# Patient Record
Sex: Female | Born: 1965 | Race: White | Hispanic: No | Smoking: Former smoker
Health system: Southern US, Community
[De-identification: ages and names within clinical notes are randomized; demographics above are authoritative.]

## PROBLEM LIST (undated history)

## (undated) DIAGNOSIS — I10 Essential (primary) hypertension: Secondary | ICD-10-CM

## (undated) DIAGNOSIS — M1990 Unspecified osteoarthritis, unspecified site: Secondary | ICD-10-CM

## (undated) DIAGNOSIS — E559 Vitamin D deficiency, unspecified: Secondary | ICD-10-CM

## (undated) DIAGNOSIS — F329 Major depressive disorder, single episode, unspecified: Secondary | ICD-10-CM

## (undated) DIAGNOSIS — R011 Cardiac murmur, unspecified: Secondary | ICD-10-CM

## (undated) DIAGNOSIS — R682 Dry mouth, unspecified: Secondary | ICD-10-CM

## (undated) DIAGNOSIS — F32A Depression, unspecified: Secondary | ICD-10-CM

## (undated) DIAGNOSIS — D649 Anemia, unspecified: Secondary | ICD-10-CM

## (undated) DIAGNOSIS — N921 Excessive and frequent menstruation with irregular cycle: Secondary | ICD-10-CM

## (undated) DIAGNOSIS — Z6841 Body Mass Index (BMI) 40.0 and over, adult: Secondary | ICD-10-CM

## (undated) DIAGNOSIS — F419 Anxiety disorder, unspecified: Secondary | ICD-10-CM

## (undated) HISTORY — PX: ANKLE SURGERY: SHX546

## (undated) HISTORY — DX: Depression, unspecified: F32.A

## (undated) HISTORY — DX: Unspecified osteoarthritis, unspecified site: M19.90

## (undated) HISTORY — DX: Cardiac murmur, unspecified: R01.1

## (undated) HISTORY — PX: HX TUBAL LIGATION: SHX77

## (undated) HISTORY — DX: Vitamin D deficiency, unspecified: E55.9

## (undated) HISTORY — DX: Essential (primary) hypertension: I10

## (undated) HISTORY — DX: Dry mouth, unspecified: R68.2

---
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
## 1990-11-05 HISTORY — PX: ANKLE SURGERY: SHX546

## 1998-11-05 HISTORY — PX: TUBAL LIGATION: SHX77

## 2005-04-16 ENCOUNTER — Emergency Department: Payer: Self-pay | Admitting: Emergency Medicine

## 2005-05-22 ENCOUNTER — Emergency Department: Payer: Self-pay | Admitting: General Practice

## 2006-01-07 ENCOUNTER — Emergency Department: Payer: Self-pay | Admitting: Emergency Medicine

## 2012-03-27 ENCOUNTER — Ambulatory Visit: Payer: Self-pay | Admitting: Cardiovascular Disease

## 2012-04-16 ENCOUNTER — Ambulatory Visit: Payer: Self-pay | Admitting: Internal Medicine

## 2012-06-04 ENCOUNTER — Other Ambulatory Visit: Payer: Self-pay

## 2012-06-04 LAB — CBC WITH DIFFERENTIAL/PLATELET
Basophil #: 0.1 10*3/uL (ref 0.0–0.1)
Eosinophil %: 3.7 %
HGB: 12.9 g/dL (ref 12.0–16.0)
Lymphocyte %: 25.9 %
MCH: 26.4 pg (ref 26.0–34.0)
MCV: 81 fL (ref 80–100)
Monocyte %: 4.7 %
Neutrophil #: 4.8 10*3/uL (ref 1.4–6.5)
Neutrophil %: 64.9 %
Platelet: 208 10*3/uL (ref 150–440)
RBC: 4.88 10*6/uL (ref 3.80–5.20)
RDW: 17.5 % — ABNORMAL HIGH (ref 11.5–14.5)
WBC: 7.3 10*3/uL (ref 3.6–11.0)

## 2012-06-04 LAB — COMPREHENSIVE METABOLIC PANEL
Albumin: 3.4 g/dL (ref 3.4–5.0)
Alkaline Phosphatase: 63 U/L (ref 50–136)
BUN: 22 mg/dL — ABNORMAL HIGH (ref 7–18)
Calcium, Total: 8.6 mg/dL (ref 8.5–10.1)
Co2: 29 mmol/L (ref 21–32)
EGFR (Non-African Amer.): >60
Glucose: 83 mg/dL (ref 65–99)
Osmolality: 286 (ref 275–301)
SGOT(AST): 13 U/L — ABNORMAL LOW (ref 15–37)
SGPT (ALT): 10 U/L — ABNORMAL LOW
Sodium: 142 mmol/L (ref 136–145)

## 2012-11-28 ENCOUNTER — Ambulatory Visit: Payer: Self-pay | Admitting: Internal Medicine

## 2013-01-12 IMAGING — US US EXTREM LOW VENOUS*L*
1 series · 14 of 24 positions shown · non-contrast
Comparison: none

REASON FOR EXAM: swelling pain
COMMENTS:

[Series 1: us extrem low venous*left* · 0.09mm/px · 14 of 25 slices shown]
[im 1/25]
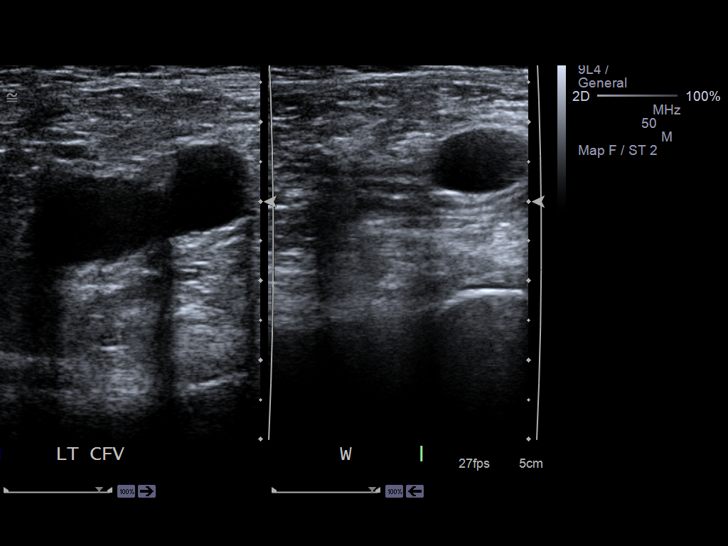
[im 3/25]
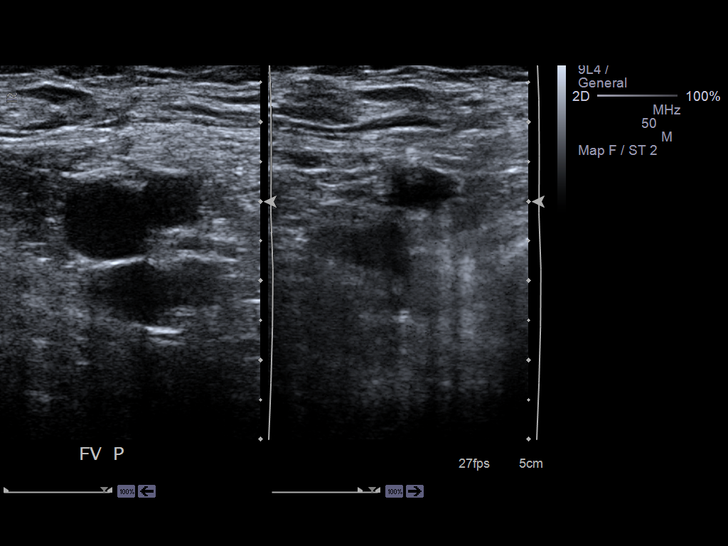
[im 5/25]
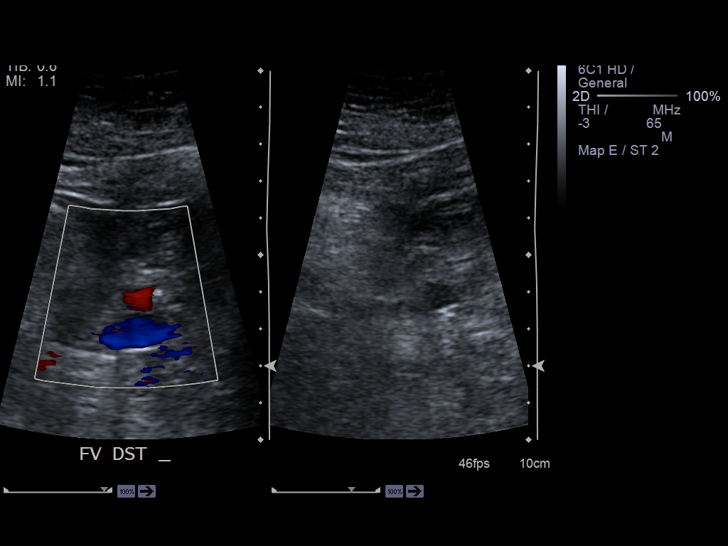
[im 7/25]
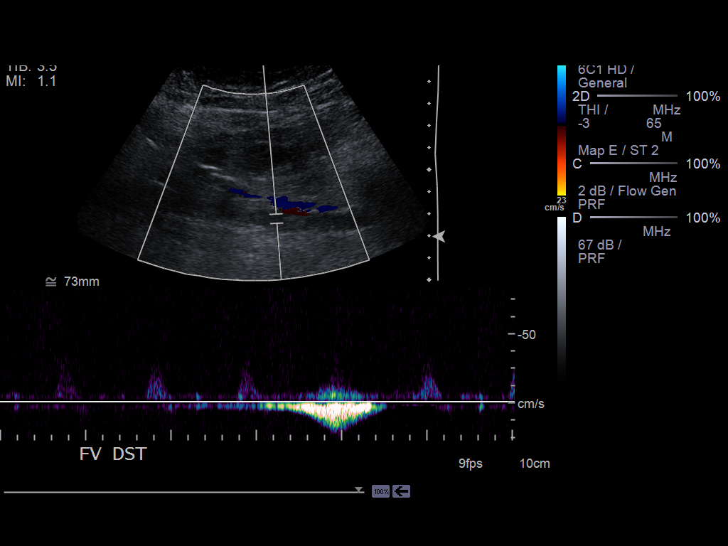
[im 8/25]
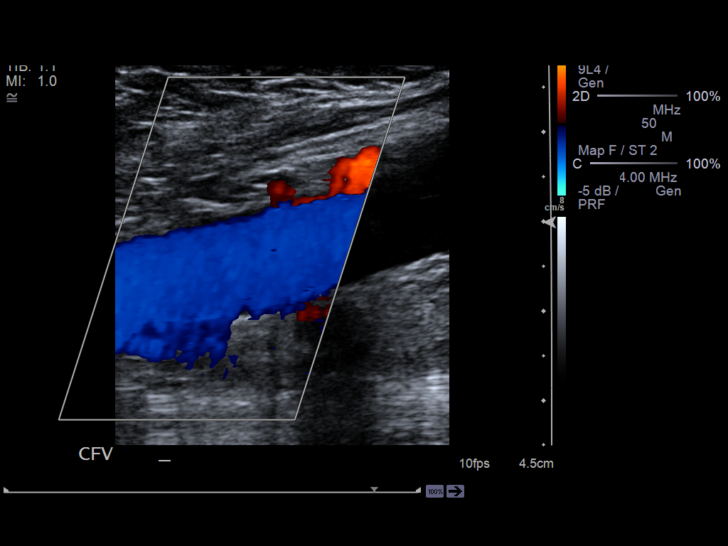
[im 10/25]
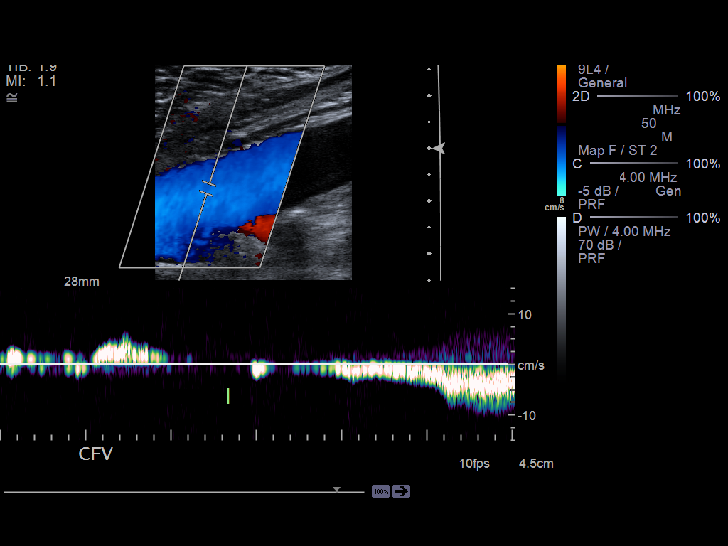
[im 12/25]
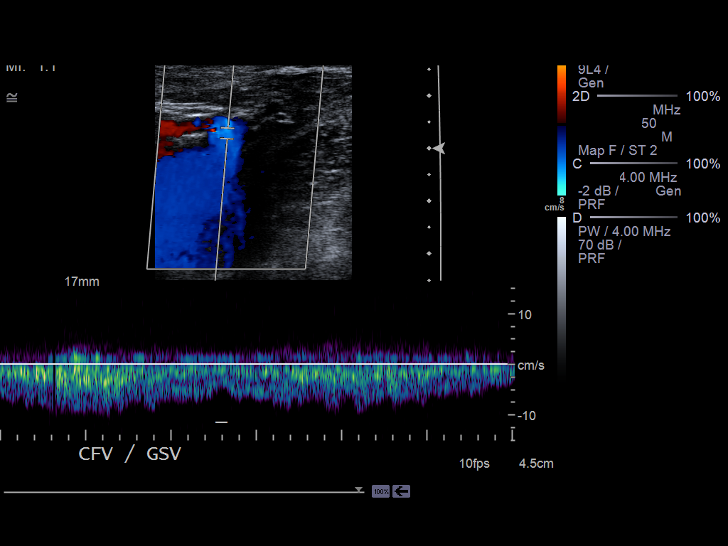
[im 13/25]
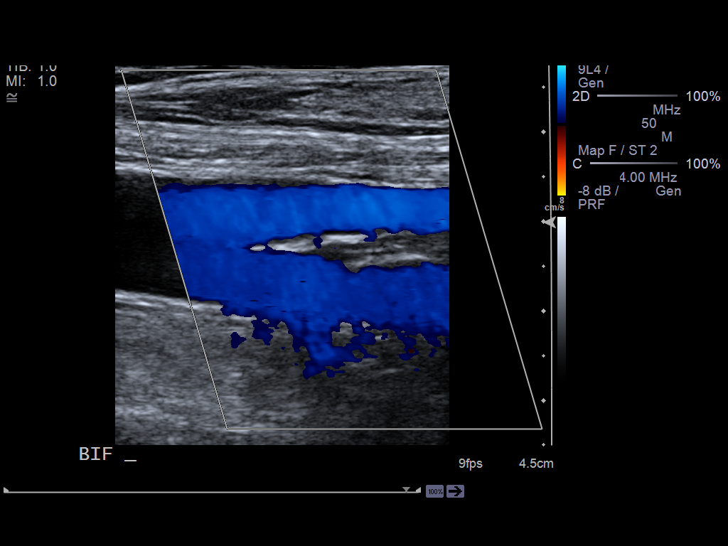
[im 15/25]
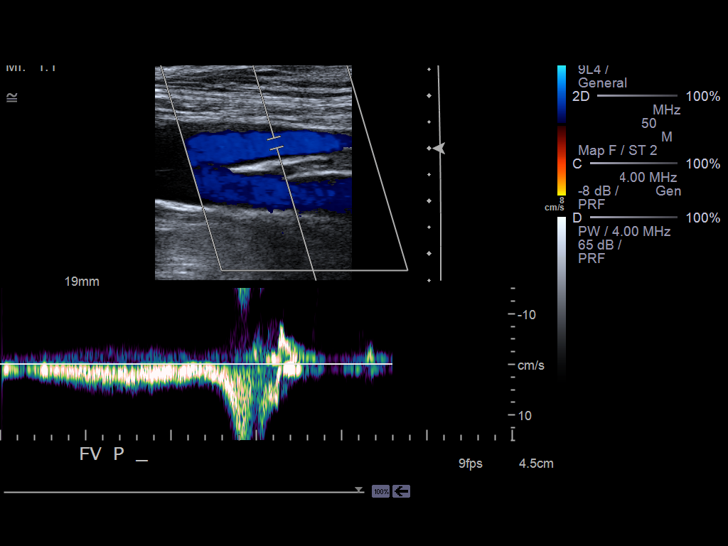
[im 17/25]
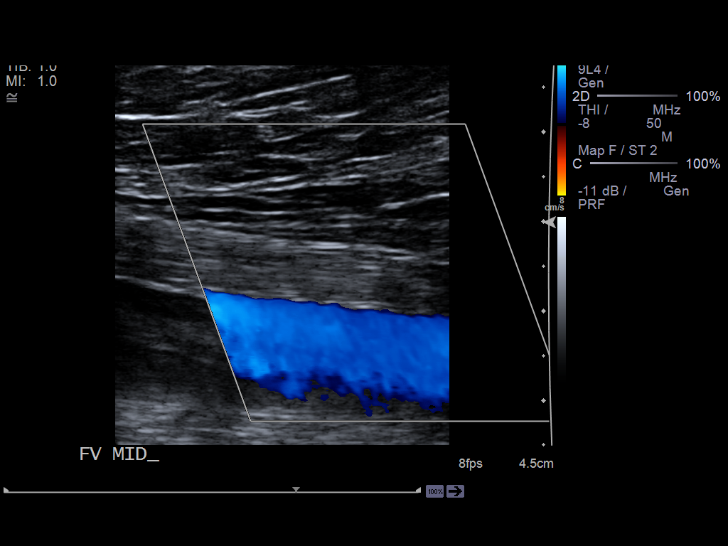
[im 19/25]
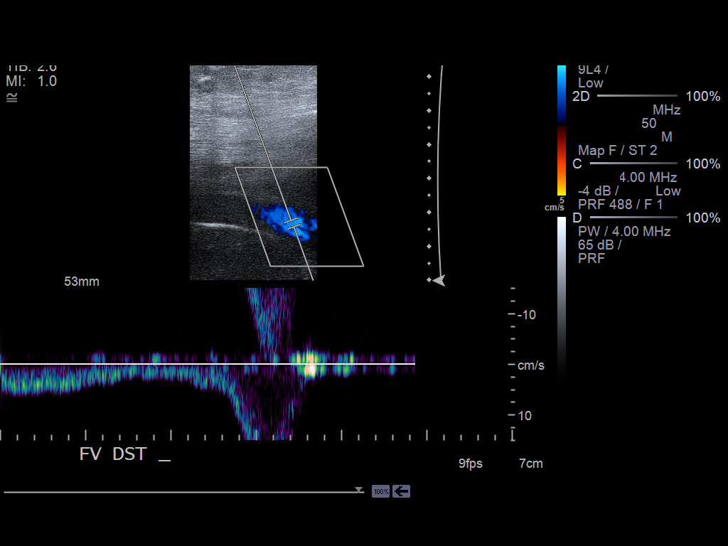
[im 20/25]
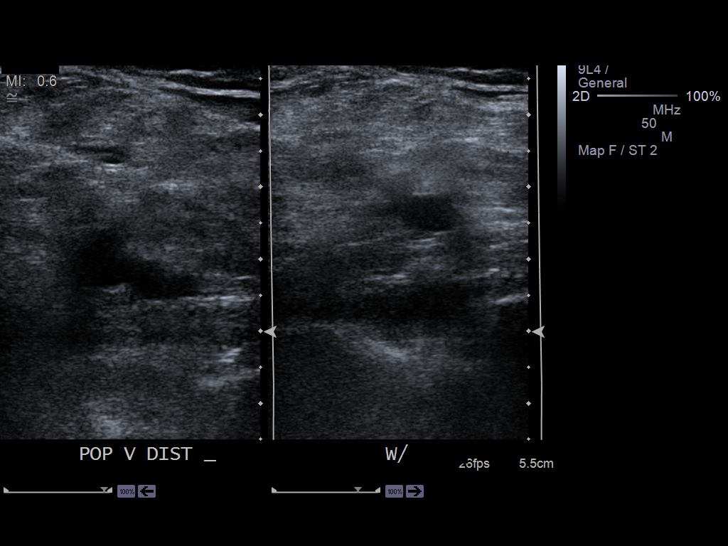
[im 22/25]
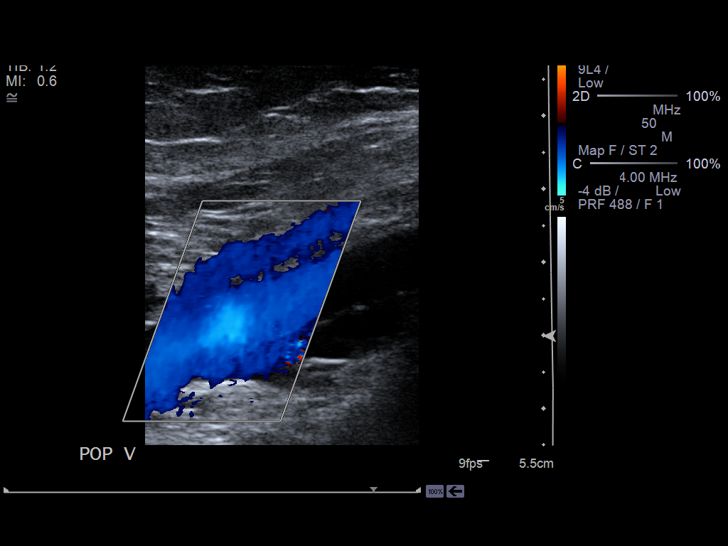
[im 25/25]
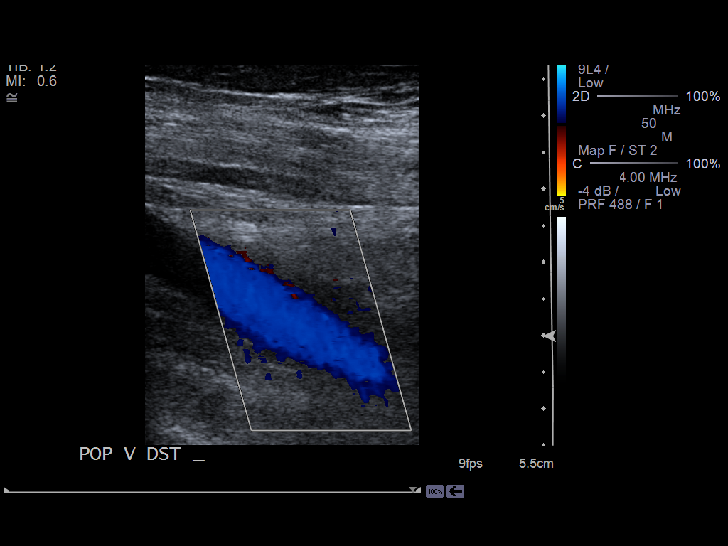

[14 of 24 positions shown; findings below may reference images not displayed]

PROCEDURE:     US  - US DOPPLER LOW EXTR LEFT  - April 16, 2012  [DATE]

RESULT:     Doppler interrogation of the deep venous system of the left leg
is performed from the common femoral vein through the popliteal vein. The
deep venous structures are fully compressible. The color and spectral
Doppler appearance is normal. There is no evidence of adenopathy or abnormal
fluid collection.
IMPRESSION: 1. No findings to suggest left lower extremity deep vein thrombosis.

[REDACTED]

## 2013-08-26 IMAGING — US TRANSABDOMINAL ULTRASOUND OF PELVIS
1 series · 14 of 25 positions shown · non-contrast
Comparison: none

REASON FOR EXAM: hemorrhage
COMMENTS:

PROCEDURE:     EUFEMIJA - EUFEMIJA PELVIS NON-OB W/TRANSVAGINAL  - November 28, 2012 [DATE]
RESULT:
TECHNIQUE: Endovaginal and transabdominal imaging of the pelvis was obtained.
The study is limited secondary to patient's body habitus.

[Series 1: transabdominal ultrasound of pelvis · 0.30mm/px · 14 of 99 slices shown]
[im 1/99]
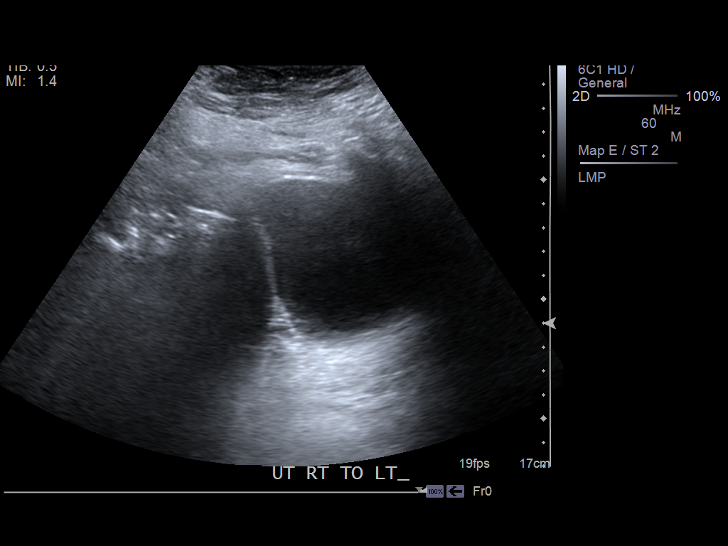
[im 9/99]
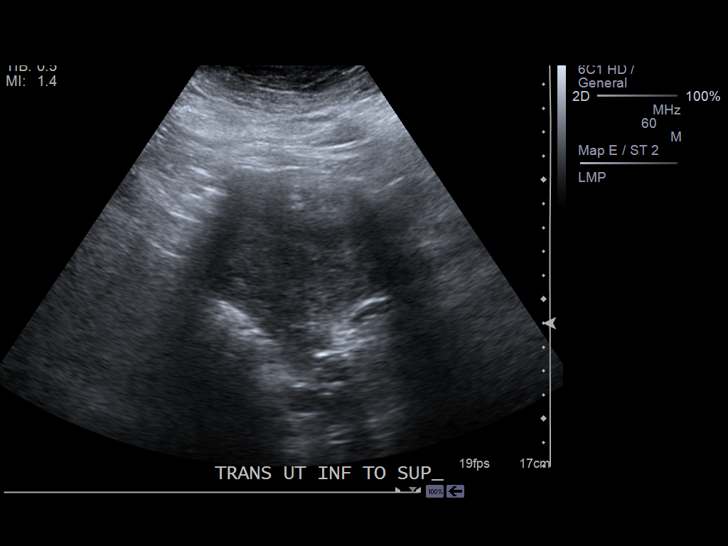
[im 17/99]
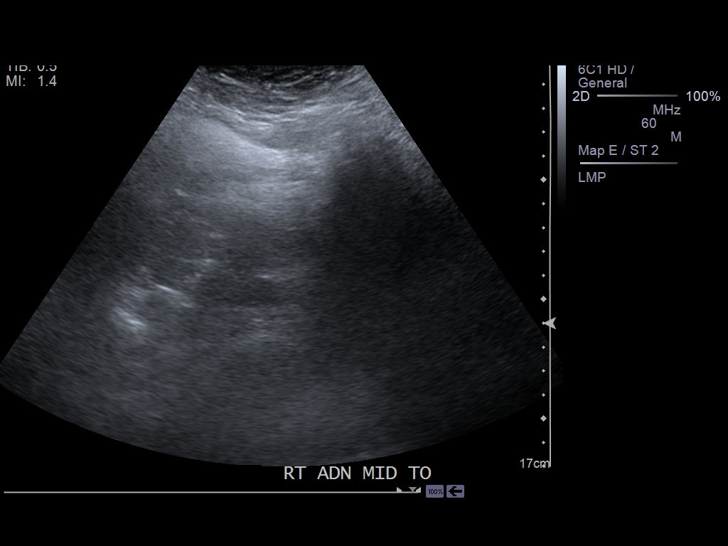
[im 25/99]
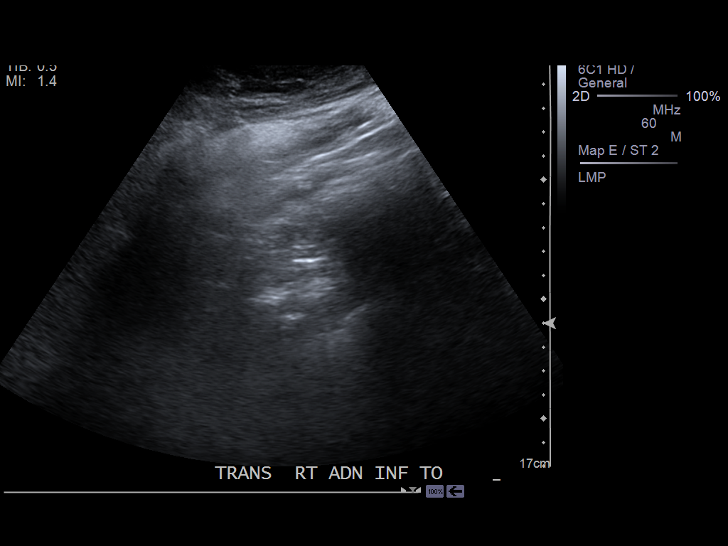
[im 33/99]
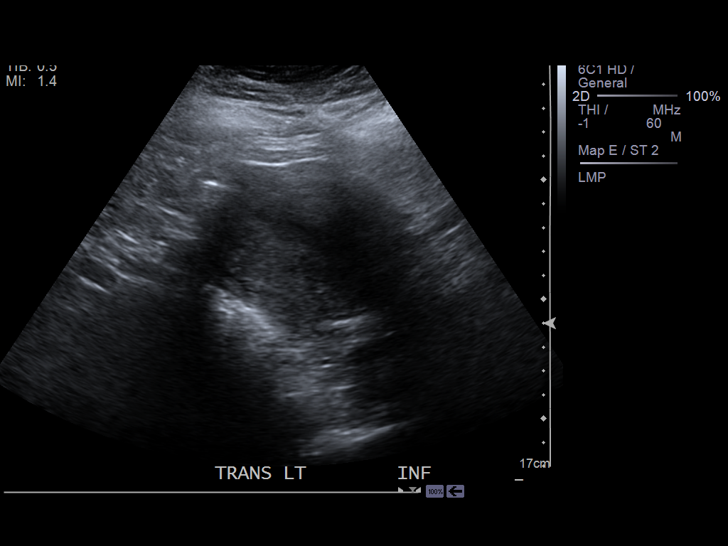
[im 37/99]
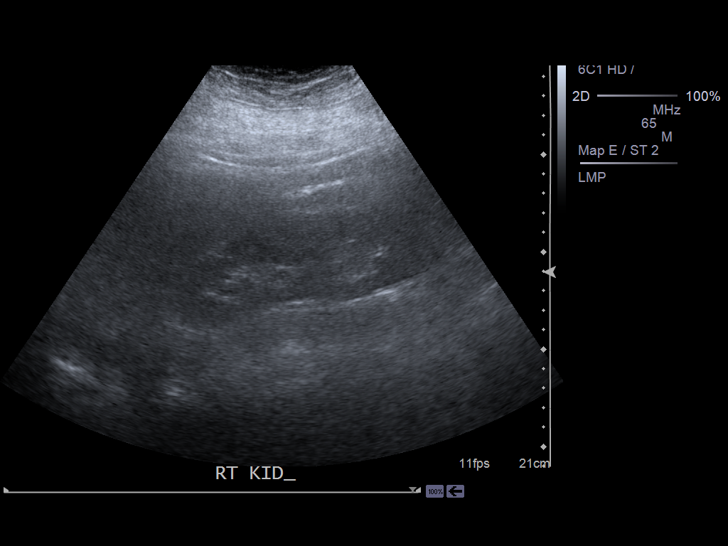
[im 45/99]
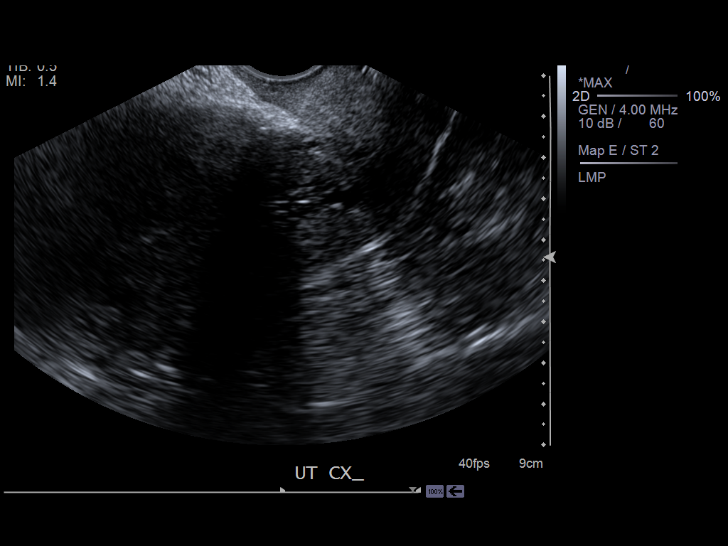
[im 54/99]
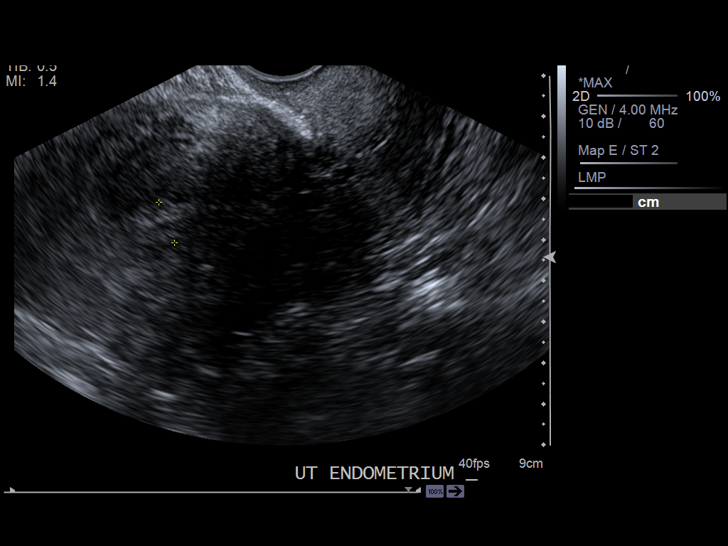
[im 62/99]
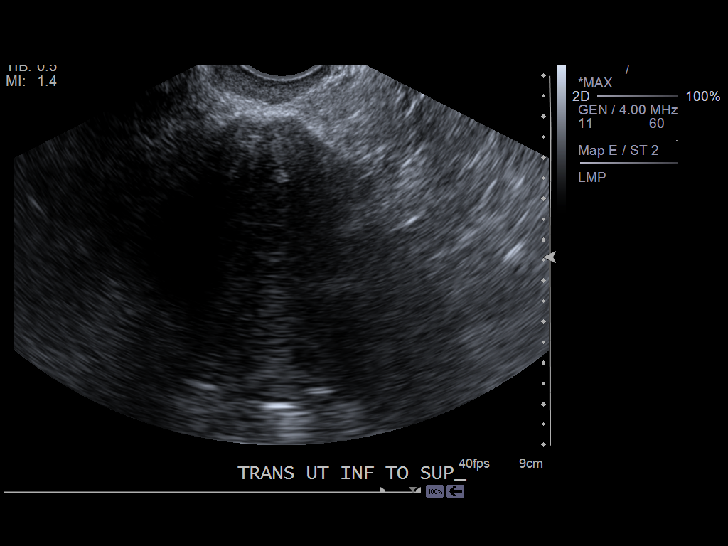
[im 66/99]
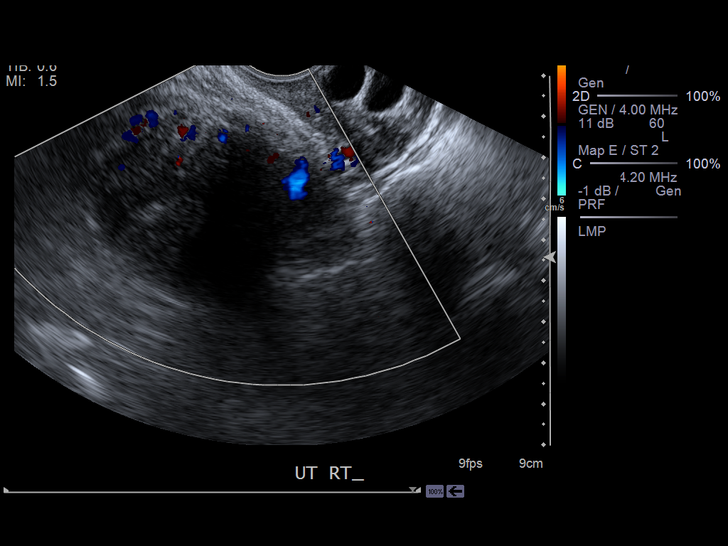
[im 74/99]
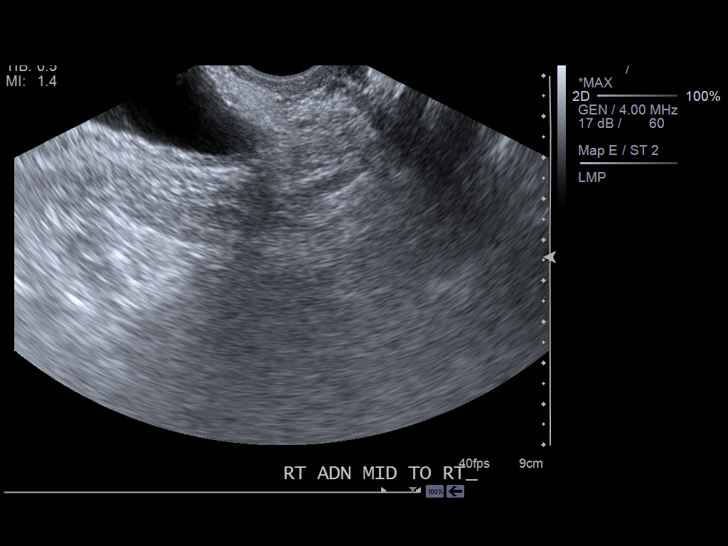
[im 82/99]
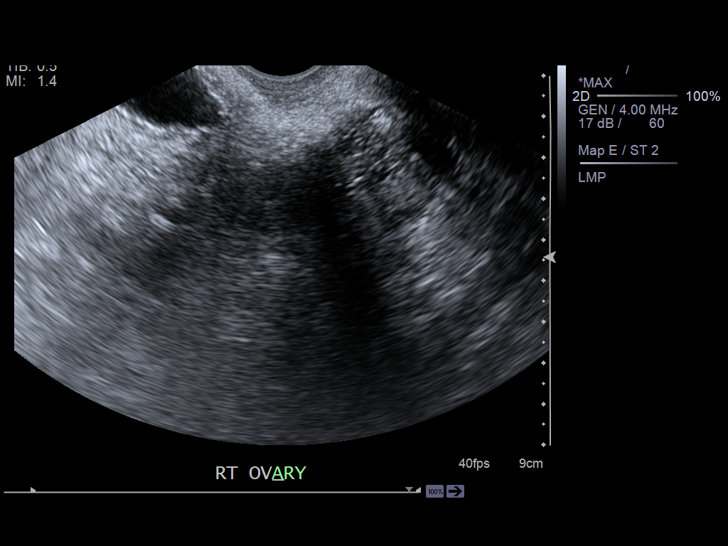
[im 90/99]
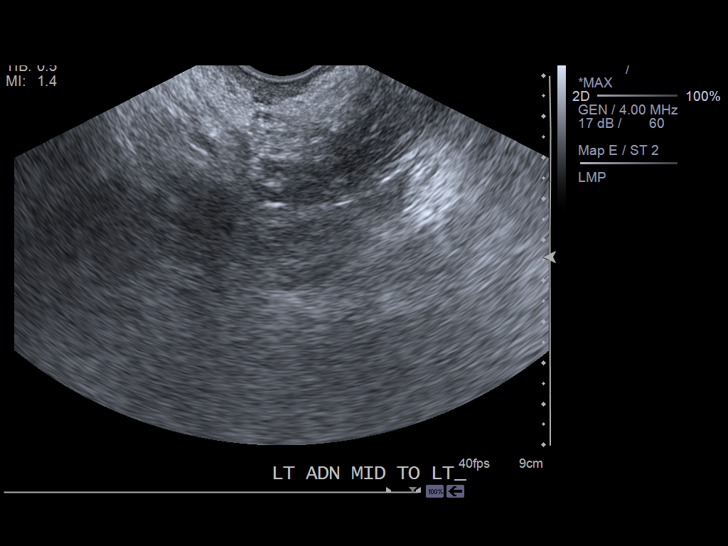
[im 99/99]
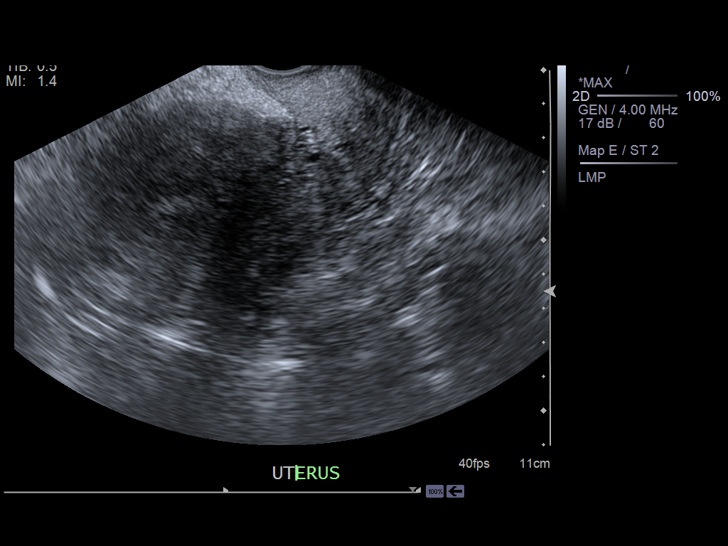

[14 of 25 positions shown; findings below may reference images not displayed]

FINDINGS: The uterus measures 12.13 x 5.64 x 8.31 cm. The endometrial
thickness is 10.16 cm. Heterogeneous mass is identified along the anterior
aspect of fundus of the uterus on the right. This area measures 4.4 x 4.42 x
4.43 cm and has the appearance of a leiomyoma. The uterus is otherwise
unremarkable. Endometrial thickness is 10.6 mm. The right ovary is
unremarkable measuring 3.05 x 1.6 x 2.1 cm. The left ovary is not
visualized. There is no evidence of hydronephrosis. The urinary bladder is
partially distended.
IMPRESSION: Leiomyoma within the fundus of the uterus. Otherwise,
unremarkable Pelvic Ultrasound.

## 2015-05-31 ENCOUNTER — Observation Stay
Admission: EM | Admit: 2015-05-31 | Discharge: 2015-06-01 | Disposition: A | Discharge status: Home / Self Care | Payer: Self-pay | Attending: Obstetrics and Gynecology | Admitting: Obstetrics and Gynecology

## 2015-05-31 ENCOUNTER — Encounter: Payer: Self-pay | Admitting: Intensive Care

## 2015-05-31 ENCOUNTER — Emergency Department: Payer: Self-pay

## 2015-05-31 DIAGNOSIS — Z6841 Body Mass Index (BMI) 40.0 and over, adult: Secondary | ICD-10-CM | POA: Insufficient documentation

## 2015-05-31 DIAGNOSIS — D62 Acute posthemorrhagic anemia: Secondary | ICD-10-CM | POA: Diagnosis present

## 2015-05-31 DIAGNOSIS — F419 Anxiety disorder, unspecified: Secondary | ICD-10-CM | POA: Insufficient documentation

## 2015-05-31 DIAGNOSIS — Z539 Procedure and treatment not carried out, unspecified reason: Secondary | ICD-10-CM | POA: Insufficient documentation

## 2015-05-31 DIAGNOSIS — D259 Leiomyoma of uterus, unspecified: Secondary | ICD-10-CM | POA: Insufficient documentation

## 2015-05-31 DIAGNOSIS — N921 Excessive and frequent menstruation with irregular cycle: Secondary | ICD-10-CM | POA: Diagnosis present

## 2015-05-31 DIAGNOSIS — R011 Cardiac murmur, unspecified: Secondary | ICD-10-CM | POA: Insufficient documentation

## 2015-05-31 DIAGNOSIS — N939 Abnormal uterine and vaginal bleeding, unspecified: Principal | ICD-10-CM | POA: Insufficient documentation

## 2015-05-31 DIAGNOSIS — R938 Abnormal findings on diagnostic imaging of other specified body structures: Secondary | ICD-10-CM | POA: Insufficient documentation

## 2015-05-31 DIAGNOSIS — F329 Major depressive disorder, single episode, unspecified: Secondary | ICD-10-CM | POA: Insufficient documentation

## 2015-05-31 DIAGNOSIS — I10 Essential (primary) hypertension: Secondary | ICD-10-CM | POA: Insufficient documentation

## 2015-05-31 DIAGNOSIS — Z8249 Family history of ischemic heart disease and other diseases of the circulatory system: Secondary | ICD-10-CM | POA: Insufficient documentation

## 2015-05-31 HISTORY — DX: Body Mass Index (BMI) 40.0 and over, adult: Z684

## 2015-05-31 HISTORY — DX: Excessive and frequent menstruation with irregular cycle: N92.1

## 2015-05-31 HISTORY — DX: Cardiac murmur, unspecified: R01.1

## 2015-05-31 HISTORY — DX: Essential (primary) hypertension: I10

## 2015-05-31 HISTORY — DX: Anxiety disorder, unspecified: F41.9

## 2015-05-31 HISTORY — DX: Depression, unspecified: F32.A

## 2015-05-31 HISTORY — DX: Morbid (severe) obesity due to excess calories: E66.01

## 2015-05-31 HISTORY — DX: Major depressive disorder, single episode, unspecified: F32.9

## 2015-05-31 HISTORY — DX: Anemia, unspecified: D64.9

## 2015-05-31 LAB — CBC WITH DIFFERENTIAL/PLATELET
BASOS ABS: 0 10*3/uL (ref 0–0.1)
BASOS PCT: 1 %
Basophils Absolute: 0.1 10*3/uL (ref 0–0.1)
Basophils Relative: 1 %
EOS ABS: 0.1 10*3/uL (ref 0–0.7)
EOS PCT: 2 %
Eosinophils Absolute: 0.2 10*3/uL (ref 0–0.7)
Eosinophils Relative: 2 %
HCT: 23.8 % — ABNORMAL LOW (ref 35.0–47.0)
HEMATOCRIT: 25.9 % — AB (ref 35.0–47.0)
Hemoglobin: 7.9 g/dL — ABNORMAL LOW (ref 12.0–16.0)
Hemoglobin: 7.3 g/dL — ABNORMAL LOW (ref 12.0–16.0)
LYMPHS ABS: 1.1 10*3/uL (ref 1.0–3.6)
LYMPHS PCT: 18 %
LYMPHS PCT: 18 %
Lymphs Abs: 1.1 10*3/uL (ref 1.0–3.6)
MCH: 21.8 pg — ABNORMAL LOW (ref 26.0–34.0)
MCH: 21.7 pg — AB (ref 26.0–34.0)
MCHC: 30.4 g/dL — ABNORMAL LOW (ref 32.0–36.0)
MCHC: 30.6 g/dL — ABNORMAL LOW (ref 32.0–36.0)
MCV: 71.6 fL — ABNORMAL LOW (ref 80.0–100.0)
MCV: 71.1 fL — AB (ref 80.0–100.0)
MONO ABS: 0.5 10*3/uL (ref 0.2–0.9)
Monocytes Absolute: 0.5 10*3/uL (ref 0.2–0.9)
Monocytes Relative: 7 %
Monocytes Relative: 8 %
NEUTROS ABS: 4.7 10*3/uL (ref 1.4–6.5)
NEUTROS PCT: 71 %
Neutro Abs: 4.3 10*3/uL (ref 1.4–6.5)
Neutrophils Relative %: 72 %
Platelets: 263 10*3/uL (ref 150–440)
Platelets: 234 10*3/uL (ref 150–440)
RBC: 3.62 MIL/uL — AB (ref 3.80–5.20)
RBC: 3.35 MIL/uL — AB (ref 3.80–5.20)
RDW: 16.2 % — ABNORMAL HIGH (ref 11.5–14.5)
RDW: 15.7 % — AB (ref 11.5–14.5)
WBC: 6.5 10*3/uL (ref 3.6–11.0)
WBC: 6 10*3/uL (ref 3.6–11.0)

## 2015-05-31 LAB — BASIC METABOLIC PANEL
ANION GAP: 7 (ref 5–15)
BUN: 9 mg/dL (ref 6–20)
CO2: 23 mmol/L (ref 22–32)
Calcium: 8.2 mg/dL — ABNORMAL LOW (ref 8.9–10.3)
Chloride: 106 mmol/L (ref 101–111)
Creatinine, Ser: 0.44 mg/dL (ref 0.44–1.00)
Glucose, Bld: 102 mg/dL — ABNORMAL HIGH (ref 65–99)
Potassium: 3.9 mmol/L (ref 3.5–5.1)
SODIUM: 136 mmol/L (ref 135–145)

## 2015-05-31 LAB — WET PREP, GENITAL
Clue Cells Wet Prep HPF POC: NONE SEEN
Trich, Wet Prep: NONE SEEN
Yeast Wet Prep HPF POC: NONE SEEN

## 2015-05-31 LAB — POCT PREGNANCY, URINE: Preg Test, Ur: NEGATIVE

## 2015-05-31 LAB — TYPE AND SCREEN
ABO/RH(D): AB POS
Antibody Screen: NEGATIVE

## 2015-05-31 LAB — CHLAMYDIA/NGC RT PCR (ARMC ONLY)
CHLAMYDIA TR: NOT DETECTED
N GONORRHOEAE: NOT DETECTED

## 2015-05-31 LAB — ABO/RH: ABO/RH(D): AB POS

## 2015-05-31 MED ORDER — LACTATED RINGERS IV SOLN
125.0000 mL/h | INTRAVENOUS | Status: DC
  Administered 2015-05-31 – 2015-06-01 (×2): 125 mL/h via INTRAVENOUS

## 2015-05-31 MED ORDER — MEDROXYPROGESTERONE ACETATE 10 MG PO TABS
20.0000 mg | ORAL_TABLET | Freq: Three times a day (TID) | ORAL | Status: DC
  Administered 2015-06-01 (×2): 20 mg via ORAL
  Filled 2015-05-31 (×4): qty 2

## 2015-05-31 MED ORDER — MEDROXYPROGESTERONE ACETATE 10 MG PO TABS
20.0000 mg | ORAL_TABLET | ORAL | Status: AC
  Administered 2015-05-31: 20 mg via ORAL
  Filled 2015-05-31: qty 2

## 2015-05-31 MED ORDER — MEDROXYPROGESTERONE ACETATE 10 MG PO TABS
20.0000 mg | ORAL_TABLET | Freq: Three times a day (TID) | ORAL | Status: DC
  Filled 2015-05-31 (×5): qty 2

## 2015-05-31 NOTE — ED Notes (Signed)
Assisted Dr Glennon Mac at bedside for vaginal exam. Pt given wet wipes and changed sheets. Pt in bed at this time.

## 2015-05-31 NOTE — ED Provider Notes (Signed)
[facility] Emergency Department Provider Note    ____________________________________________  Time seen: 1015  I have reviewed the triage vital signs and the nursing notes.   HISTORY  Chief Complaint Vaginal Bleeding   History limited by: Not Limited   HPI Mackenzie Lopez is a 49 y.o. female who presents to the emergency department today because of concerns for abnormal vaginal bleeding. The patient states that she started her period at the beginning of the month. Since then she has had continued bleeding. For the past week she has been passing blood clots. She states that there is one episode when after passing the blood clots she felt faint. She denies any pelvic pain. Denies any fevers. Denies any chest pain or shortness of breath. Denies any history of fibroids. States last time she saw her OB/GYN was 2 years ago for a routine checkup and denies any abnormal or concerning findings for that visit.   Past Medical History  Diagnosis Date  . Hypertension     There are no active problems to display for this patient.   Past Surgical History  Procedure Laterality Date  . Tubal ligation Bilateral   . Ankle surgery Left     No current outpatient prescriptions on file.  Allergies Review of patient's allergies indicates not on file.  History reviewed. No pertinent family history.  Social History History  Substance Use Topics  . Smoking status: Former Smoker    Types: Cigarettes  . Smokeless tobacco: Never Used  . Alcohol Use: No    Review of Systems  Constitutional: Negative for fever. Cardiovascular: Negative for chest pain. Respiratory: Negative for shortness of breath. Gastrointestinal: Negative for abdominal pain, vomiting and diarrhea. Genitourinary: Negative for dysuria. Musculoskeletal: Negative for back pain. Skin: Negative for rash. Neurological: Negative for headaches, focal weakness or numbness.   10-point ROS otherwise  negative.  ____________________________________________   PHYSICAL EXAM:  VITAL SIGNS: ED Triage Vitals  Enc Vitals Group     BP 05/31/15 1009 184/101 mmHg     Pulse Rate 05/31/15 1009 92     Resp 05/31/15 1009 16     Temp 05/31/15 1009 97.8 F (36.6 C)     Temp Source 05/31/15 1009 Oral     SpO2 05/31/15 1009 99 %     Weight 05/31/15 1009 446 lb 8 oz (202.531 kg)     Height 05/31/15 1009 5\' 9"  (1.753 m)   Constitutional: Alert and oriented. Well appearing and in no distress. Eyes: Conjunctivae are normal. PERRL. Normal extraocular movements. ENT   Head: Normocephalic and atraumatic.   Nose: No congestion/rhinnorhea.   Mouth/Throat: Mucous membranes are moist.   Neck: No stridor. Hematological/Lymphatic/Immunilogical: No cervical lymphadenopathy. Cardiovascular: Normal rate, regular rhythm.  No murmurs, rubs, or gallops. Respiratory: Normal respiratory effort without tachypnea nor retractions. Breath sounds are clear and equal bilaterally. No wheezes/rales/rhonchi. Gastrointestinal: Soft and nontender. No distention.  Genitourinary: No external lesions. Blood clots and fresh blood in vaginal vault. Musculoskeletal: Normal range of motion in all extremities. No joint effusions.  No lower extremity tenderness nor edema. Neurologic:  Normal speech and language. No gross focal neurologic deficits are appreciated. Speech is normal.  Skin:  Skin is warm, dry and intact. No rash noted. Psychiatric: Mood and affect are normal. Speech and behavior are normal. Patient exhibits appropriate insight and judgment.  ____________________________________________    LABS (pertinent positives/negatives)  Labs Reviewed  WET PREP, GENITAL - Abnormal; Notable for the following:    WBC, Wet  Prep HPF POC FEW (*)    All other components within normal limits  CBC WITH DIFFERENTIAL/PLATELET - Abnormal; Notable for the following:    RBC 3.62 (*)    Hemoglobin 7.9 (*)    HCT 25.9 (*)     MCV 71.6 (*)    MCH 21.8 (*)    MCHC 30.4 (*)    RDW 16.2 (*)    All other components within normal limits  BASIC METABOLIC PANEL - Abnormal; Notable for the following:    Glucose, Bld 102 (*)    Calcium 8.2 (*)    All other components within normal limits  CBC WITH DIFFERENTIAL/PLATELET - Abnormal; Notable for the following:    RBC 3.35 (*)    Hemoglobin 7.3 (*)    HCT 23.8 (*)    MCV 71.1 (*)    MCH 21.7 (*)    MCHC 30.6 (*)    RDW 15.7 (*)    All other components within normal limits  CHLAMYDIA/NGC RT PCR ([facility] ONLY)  POCT PREGNANCY, URINE  TYPE AND SCREEN  ABO/RH     ____________________________________________   EKG  I, Nance Pear, attending physician, personally viewed and interpreted this EKG  EKG Time: 1019 Rate: 84 Rhythm: normal sinus rhythm Axis: normal Intervals: normal QRS: normal ST changes: no st elevation    ____________________________________________    RADIOLOGY  Pelvic US IMPRESSION: 1. Diffusely thickened and heterogeneous endometrium. Differential considerations include endometrial hyperplasia, endometrial polyps and endometrial neoplasm. Recommend referral to OBGYN for further evaluation. 2. Small 1.5 cm fibroid in the posterior aspect of the uterus. 3. Previously identified 4.4 cm fibroid arising from the anterior aspect of the fundus could not be visualized on today's examination. 4. Neither ovary could be visualized on today's examination.   ____________________________________________   PROCEDURES  Procedure(s) performed: None  Critical Care performed: No  ____________________________________________   INITIAL IMPRESSION / ASSESSMENT AND PLAN / ED COURSE  Pertinent labs & imaging results that were available during my care of the patient were reviewed by me and considered in my medical decision making (see chart for details).  Patient presents to the emergency department today with abnormal vaginal  bleeding. On exam patient without any abdominal tenderness. Pelvic exam does show fresh blood in the vaginal vault. Pelvic ultrasound concerning for thickened endometrium. Additionally repeat blood test went from 7.9 to 7.3. I have discussed with Dr. Glennon Mac with Ob/Gyn who will come evaluate the patient.   ____________________________________________   FINAL CLINICAL IMPRESSION(S) / ED DIAGNOSES  Vaginal Bleeding Anemia  Nance Pear, MD 05/31/15 1504

## 2015-05-31 NOTE — ED Notes (Signed)
Pt arrived by EMS from home. Patient reports vaginal bleeding that began on July 1st. Last week patient reports passing blood clots that have been on and off since last week. Patient reports having HTN but does not take any meds for it.

## 2015-05-31 NOTE — ED Notes (Signed)
Patient transported to Ultrasound 

## 2015-05-31 NOTE — H&P (Signed)
GYNECOLOGY ADMISSION HISTORY AND PHYSICAL  Mackenzie Lopez [id number] 05/31/2015 4:36 PM    Chief Complaint:   Mackenzie Lopez is a 49 y.o. [id number] perimenopausal female seen at the request of Dr. Archie Balboa for evaluation of heavy vaginal bleeding and anemia.    History of Present Ilness:   The patient is a 49 year old gravida 16 para 69 female who presents for the above complaints. Her last menstrual period began at the beginning of July and has gotten gradually heavier, to the point of passing clots. She presented to the emergency room today because she could no longer take heavy bleeding and passage of clots. She had no menses between October and May of this past year. She otherwise has had no major issues with her menses. She denies feeling lightheaded and having chest pain. She denies feeling weak. She does note increased shortness of breath with regular daily activity. She notes early satiety and bloating. Denies constipation, night sweats, and weight loss. Her last pelvic exam was approximately 2 years ago which she reported as normal.  Past Medical History  Diagnosis Date  . Hypertension   . Depression   . Anxiety   . Heart murmur   . Morbid obesity with BMI of 60.0-69.9, adult   . Anemia     secondary to acute blood loss from menorrhagia  . Menorrhagia with irregular cycle    Past Surgical History  Procedure Laterality Date  . Tubal ligation Bilateral 2000  . Ankle surgery Left 1992  . Cesarean section  1986   Medications:  None reported  Allergies: No Known Allergies  Obstetric History: She is a [id number] female s/p cesarean delivery for G1 followed by 5 successful vaginal deliveries after cesarean.    Gynecologic History:   Last Pap: 2 years ago and normal per patient report Contraception: status post BTL in 2000   Social History:  She  reports that she has quit smoking. Her smoking use included Cigarettes. She has never used smokeless tobacco. She reports that she does  not drink alcohol or use illicit drugs.  Family History:  Cardiac issues in older relatives   Review of Systems:  Negative x 10 systems reviewed except as noted in the HPI.    Objective    BP 158/84 mmHg  Pulse 93  Temp(Src) 97.8 F (36.6 C) (Oral)  Resp 15  Ht 5\' 9"  (1.753 m)  Wt 446 lb 8 oz (202.531 kg)  BMI 65.91 kg/m2  SpO2 100%  LMP 05/29/2015 Physical Exam  General:  She is a obese appearing female in no distress.  HEENT:  Normocephalic, atraumatic.   Neck:  supple, no lymphadenopathy Cardiac:  RRR Pulmonary:  Clear to auscultation bilaterally. No wheezes, rales, rhonchi.  Restrictive breathing pattern Abdomen:  Morbidly obese, soft, non-tender to palpation. Normoactive bowel sounds.  Masses difficult to assess given body habitus.  Possible large ventral hernia.  Pelvic:       External: normal external female genitalia.  Bleeding noted on perineum      Vagina: normal BUS, no architectural distortion, pink and normally ruggated without obvious lesions. Difficult to assess entire vagina there is laxity and the vaginal sidewalls preclude full visualization.  No masses palpated on bimanual. Approximately 20 mL of dark red blood in vaginal vault.     Cervix: no able to visualize.  Palpates normal.       Uterus: no able to palpate given body habitus      Adnexa:  no able to palpate  given body habitus Extremities:  Non-tender, symmetric no edema bilaterally.   Neurologic:  Alert & oriented x 3.  Appropriate, conversant.    Procedure note: Attempted endometrial biopsy with Pipelle.  Given that the cervix was not able to be visualized using a speculum, a blind biopsy was attempted. A bimanual was performed and the cervical os was located with the gloved fingers and the Pipelle was gently guided through the cervical os. An attempt to advance the Pipelle past the internal cervical os was made without success. Several gentle attempts were made without success. The procedure at this point  was then abandoned.  Note: A female chaperone was present throughout the entire portion of the pelvic exam  Laboratory Results:   Lab Results  Component Value Date   WBC 6.0 05/31/2015   RBC 3.35* 05/31/2015   HGB 7.3* 05/31/2015   HCT 23.8* 05/31/2015   PLT 234 05/31/2015   NA 136 05/31/2015   K 3.9 05/31/2015   CREATININE 0.44 05/31/2015   Lab Results  Component Value Date   PREGTESTUR NEGATIVE 05/31/2015    Imaging Results (REPORT):  CLINICAL DATA: 49 year old female with 1 month history of vaginal bleeding  EXAM: TRANSABDOMINAL AND TRANSVAGINAL ULTRASOUND OF PELVIS  TECHNIQUE: Both transabdominal and transvaginal ultrasound examinations of the pelvis were performed. Transabdominal technique was performed for global imaging of the pelvis including uterus, ovaries, adnexal regions, and pelvic cul-de-sac. It was necessary to proceed with endovaginal exam following the transabdominal exam to visualize the endometrium and ovaries.  COMPARISON: Prior pelvic ultrasound 11/28/2012  FINDINGS: Uterus  Measurements: 12.7 x 7.4 x 10.4 cm. Small 1.5 cm hypoechoic focus in the posterior aspect of the uterus likely represents a small uterine fibroid. The previously described 4.4 cm leiomyoma along the anterior aspect of the uterine fundus is not seen on today's study secondary to technical difficulties related to patient body habitus.  Endometrium  Thickness: 28 mm. Diffusely thickened. No internal vascularity.  Right ovary  Unable to visualize  Left ovary  Unable to visualize  Other findings  No free fluid.  IMPRESSION: 1. Diffusely thickened and heterogeneous endometrium. Differential considerations include endometrial hyperplasia, endometrial polyps and endometrial neoplasm. Recommend referral to OBGYN for further evaluation. 2. Small 1.5 cm fibroid in the posterior aspect of the uterus. 3. Previously identified 4.4 cm fibroid arising  from the anterior aspect of the fundus could not be visualized on today's examination. 4. Neither ovary could be visualized on today's examination.   Electronically Signed  By: Jacqulynn Cadet M.D.  On: 05/31/2015 14:42   Assessment & Recommendations   Mackenzie Lopez is a 49 y.o. [phone] perimenopausal female with abnormal uterine bleeding of unknown etiology with resultant acute blood loss anemia.  Differential diagnosis includes a structural etiology including a polyp. Also possible is a neoplastic process such as a precancerous or cancerous lesion. Given her body habitus, she may have an anovulatory type of cycle and this may also explain her heavy and prolonged bleeding.    Recommendations:  1.  We will admit for observation. We will recheck her CBC in about 6 hours. Will start Provera 20 mg by mouth 3 times a day. If bleeding persists, may need to take to the operating room for a dilation and curettage and endometrial sampling. 2.  If her bleeding subsides and her blood count stabilized, patient should be seen as an outpatient for attempted endometrial biopsy. 3. The patient also understands that if her blood counts continue to drop that she  may also need a blood transfusion. We will discuss this if the need arises.  Will Bonnet, MD 05/31/2015 4:36 PM

## 2015-06-01 LAB — CBC
HCT: 22.6 % — ABNORMAL LOW (ref 35.0–47.0)
HEMOGLOBIN: 6.9 g/dL — AB (ref 12.0–16.0)
MCH: 21.8 pg — AB (ref 26.0–34.0)
MCHC: 30.4 g/dL — AB (ref 32.0–36.0)
MCV: 71.8 fL — ABNORMAL LOW (ref 80.0–100.0)
PLATELETS: 234 10*3/uL (ref 150–440)
RBC: 3.15 MIL/uL — AB (ref 3.80–5.20)
RDW: 16.2 % — ABNORMAL HIGH (ref 11.5–14.5)
WBC: 6.3 10*3/uL (ref 3.6–11.0)

## 2015-06-01 LAB — TSH: TSH: 1.766 u[IU]/mL (ref 0.350–4.500)

## 2015-06-01 MED ORDER — MEDROXYPROGESTERONE ACETATE 10 MG PO TABS: 20.0000 mg | ORAL_TABLET | Freq: Three times a day (TID) | ORAL | Status: DC

## 2015-06-01 MED ORDER — SODIUM CHLORIDE 0.9 % IV SOLN
510.0000 mg | Freq: Once | INTRAVENOUS | Status: AC
  Administered 2015-06-01: 510 mg via INTRAVENOUS
  Filled 2015-06-01: qty 17

## 2015-06-01 NOTE — Progress Notes (Signed)
Patient discharged home. Discharge instructions, prescriptions and follow up appointment given to and reviewed with patient. Patient verbalized understanding. Patient will leave via taxi once taxi arrives.

## 2015-06-01 NOTE — Discharge Summary (Signed)
Pt dc'd to home- escorted to visitors entrance via w/c by CNA. Taxi voucher given to pt.

## 2015-06-01 NOTE — Care Management (Signed)
Received referral for care management for transportation and medications. Spoke with Mackenzie Lopez at the bedside. Lives alone in [place] at an apartment provided by [name] x 1 year per [name]. [facility] prior to this residence.  Never been to [facility]. Seen [name] in 2014 and [name] in the pass few years for vaginal bleeding and was on provera. Separated from husband for 3 years.  States she has 6 children that are  ages 16-20. They live with her husband or they are out on their own  . They can't help her, neither can her mother, Mackenzie Lopez ([phone]). Mother lives in [place]. States she has used [employer]) in the past, but has no money for transportation. States she is not eligible for Medicaid or SSI. States she has no income. A friend takes her to the grocery store once a month. States she pays for her groceries with food stamps.  Will give taxi voucher for transportation home. Information on resources in [employer] in [place], information on transportation services in [place]. Will  give 30 days supply of Provera at discharge, Application for [employer] given and discussed. Informed of the importance of getting application completed and submitted to [place]. Provera is on the $4.00 list at [facility]. States she has no money for medications. [name] RN MSN Care Management [phone]

## 2018-03-11 ENCOUNTER — Encounter: Payer: Self-pay | Admitting: Family Medicine

## 2018-03-11 ENCOUNTER — Ambulatory Visit (INDEPENDENT_AMBULATORY_CARE_PROVIDER_SITE_OTHER): Payer: Medicaid Other | Admitting: Family Medicine

## 2018-03-11 VITALS — BP 149/91 | HR 78 | Temp 98.6°F | Ht 67.0 in | Wt 357.4 lb

## 2018-03-11 DIAGNOSIS — I10 Essential (primary) hypertension: Secondary | ICD-10-CM | POA: Insufficient documentation

## 2018-03-11 DIAGNOSIS — Z13220 Encounter for screening for lipoid disorders: Secondary | ICD-10-CM

## 2018-03-11 DIAGNOSIS — Z1239 Encounter for other screening for malignant neoplasm of breast: Secondary | ICD-10-CM

## 2018-03-11 DIAGNOSIS — Z862 Personal history of diseases of the blood and blood-forming organs and certain disorders involving the immune mechanism: Secondary | ICD-10-CM | POA: Diagnosis not present

## 2018-03-11 DIAGNOSIS — Z1231 Encounter for screening mammogram for malignant neoplasm of breast: Secondary | ICD-10-CM | POA: Diagnosis not present

## 2018-03-11 DIAGNOSIS — J4520 Mild intermittent asthma, uncomplicated: Secondary | ICD-10-CM

## 2018-03-11 LAB — UA/M W/RFLX CULTURE, ROUTINE
Bilirubin, UA: NEGATIVE
Glucose, UA: NEGATIVE
Ketones, UA: NEGATIVE
NITRITE UA: NEGATIVE
PH UA: 5.5 (ref 5.0–7.5)
Protein, UA: NEGATIVE
RBC, UA: NEGATIVE
Specific Gravity, UA: 1.025 (ref 1.005–1.030)
Urobilinogen, Ur: 0.2 mg/dL (ref 0.2–1.0)

## 2018-03-11 LAB — MICROALBUMIN, URINE WAIVED
Creatinine, Urine Waived: 200 mg/dL (ref 10–300)
Microalb, Ur Waived: 30 mg/L — ABNORMAL HIGH (ref 0–19)
Microalb/Creat Ratio: <30 mg/g (ref <30)

## 2018-03-11 LAB — MICROSCOPIC EXAMINATION: Bacteria, UA: NONE SEEN

## 2018-03-11 LAB — BAYER DCA HB A1C WAIVED: HB A1C (BAYER DCA - WAIVED): 5.1 % (ref <7.0)

## 2018-03-11 MED ORDER — LISINOPRIL 5 MG PO TABS: 5.0000 mg | ORAL_TABLET | Freq: Every day | ORAL | 3 refills | Status: DC

## 2018-03-11 MED ORDER — ALBUTEROL SULFATE HFA 108 (90 BASE) MCG/ACT IN AERS: 2.0000 | INHALATION_SPRAY | Freq: Four times a day (QID) | RESPIRATORY_TRACT | 3 refills | Status: AC | PRN

## 2018-03-11 NOTE — Progress Notes (Signed)
BP (!) 149/91 (BP Location: Left Arm, Cuff Size: Large)   Pulse 78   Temp 98.6 F (37 C)   Ht 5\' 7"  (1.702 m)   Wt (!) 357 lb 6 oz (162.1 kg)   LMP 05/29/2015   SpO2 97%   BMI 55.97 kg/m    Subjective:    Patient ID: Mackenzie Lopez, female    DOB: 11-Jul-1966, 52 y.o.   MRN: [id number]  HPI: Mackenzie Lopez is a 52 y.o. female who presents today to establish care. She has not seen a doctor in many years. She has no known medical problems- although she was anemic in the ER about 3 years ago.   Chief Complaint  Patient presents with  . Hypertension  . Establish Care  . Referral    nutritionist    OBESITY Duration: Chronic Previous attempts at weight loss: yes- has been watching her diet. Has been exercising Complications of obesity: unknown Peak weight: 446 lbs Weight loss goal: to be healthy Weight loss to date: 89lbs  HYPERTENSION Hypertension status: uncontrolled  Satisfied with current treatment? no Duration of hypertension: chronic BP monitoring frequency:  not checking BP medication side effects:  Not on anything Medication compliance: Not on anything Previous BP meds: unknown Aspirin: no Recurrent headaches: no Visual changes: no Palpitations: no Dyspnea: no Chest pain: no Lower extremity edema: no Dizzy/lightheaded: no   Active Ambulatory Problems    Diagnosis Date Noted  . Morbid obesity ([place]) 03/11/2018  . HTN (hypertension) 03/11/2018  . Mild intermittent asthma 03/11/2018   Resolved Ambulatory Problems    Diagnosis Date Noted  . Menorrhagia with irregular cycle 05/31/2015  . Anemia associated with acute blood loss 05/31/2015  . Morbid obesity with BMI of 60.0-69.9, adult ([place]) 05/31/2015  . Anemia due to acute blood loss 05/31/2015   Past Medical History:  Diagnosis Date  . Anemia   . Anxiety   . Depression   . Heart murmur   . Hypertension   . Menorrhagia with irregular cycle   . Morbid obesity with BMI of 60.0-69.9, adult [facility])     Past Surgical History:  Procedure Laterality Date  . ANKLE SURGERY Left 1992  . CESAREAN SECTION  1986  . TUBAL LIGATION Bilateral 2000   Outpatient Encounter Medications as of 03/11/2018  Medication Sig  . albuterol (PROVENTIL HFA;VENTOLIN HFA) 108 (90 Base) MCG/ACT inhaler Inhale 2 puffs into the lungs every 6 (six) hours as needed for wheezing or shortness of breath.  . lisinopril (PRINIVIL,ZESTRIL) 5 MG tablet Take 1 tablet (5 mg total) by mouth daily.  . [DISCONTINUED] medroxyPROGESTERone (PROVERA) 10 MG tablet Take 2 tablets (20 mg total) by mouth every 8 (eight) hours.   No facility-administered encounter medications on file as of 03/11/2018.    No Known Allergies  Family History  Problem Relation Age of Onset  . Dementia Maternal Grandmother   . Cancer Maternal Grandmother        Lung and liver  . Alcohol abuse Maternal Grandfather   . Heart disease Maternal Grandfather   . Heart attack Maternal Grandfather     Social History   Socioeconomic History  . Marital status: Legally Separated    Spouse name: Not on file  . Number of children: Not on file  . Years of education: Not on file  . Highest education level: Not on file  Occupational History  . Not on file  Social Needs  . Financial resource strain: Not on file  .  Food insecurity:    Worry: Not on file    Inability: Not on file  . Transportation needs:    Medical: Not on file    Non-medical: Not on file  Tobacco Use  . Smoking status: Former Smoker    Types: Cigarettes  . Smokeless tobacco: Never Used  Substance and Sexual Activity  . Alcohol use: No  . Drug use: No  . Sexual activity: Not Currently  Lifestyle  . Physical activity:    Days per week: Not on file    Minutes per session: Not on file  . Stress: Not on file  Relationships  . Social connections:    Talks on phone: Not on file    Gets together: Not on file    Attends religious service: Not on file    Active member of club or  organization: Not on file    Attends meetings of clubs or organizations: Not on file    Relationship status: Not on file  . Intimate partner violence:    Fear of current or ex partner: Not on file    Emotionally abused: Not on file    Physically abused: Not on file    Forced sexual activity: Not on file  Other Topics Concern  . Not on file  Social History Narrative  . Not on file    Review of Systems  Constitutional: Negative.   Respiratory: Positive for shortness of breath (with nervous). Negative for apnea, cough, choking, chest tightness, wheezing and stridor.   Cardiovascular: Negative.   Gastrointestinal: Negative.   Genitourinary: Positive for frequency and urgency. Negative for decreased urine volume, difficulty urinating, dyspareunia, dysuria, enuresis, flank pain, genital sores, hematuria, menstrual problem, pelvic pain, vaginal bleeding, vaginal discharge and vaginal pain.  Neurological: Negative for dizziness, tremors, seizures, syncope, facial asymmetry, speech difficulty, weakness, light-headedness, numbness and headaches.       Has been having issues with her balance for about a month  Psychiatric/Behavioral: Negative.     Per HPI unless specifically indicated above     Objective:    BP (!) 149/91 (BP Location: Left Arm, Cuff Size: Large)   Pulse 78   Temp 98.6 F (37 C)   Ht 5\' 7"  (1.702 m)   Wt (!) 357 lb 6 oz (162.1 kg)   LMP 05/29/2015   SpO2 97%   BMI 55.97 kg/m   Wt Readings from Last 3 Encounters:  03/11/18 (!) 357 lb 6 oz (162.1 kg)  05/31/15 (!) 446 lb 8 oz (202.5 kg)    Physical Exam  Constitutional: She is oriented to person, place, and time. She appears well-developed and well-nourished. No distress.  Morbidly obese  HENT:  Head: Normocephalic and atraumatic.  Right Ear: Hearing normal.  Left Ear: Hearing normal.  Nose: Nose normal.  Eyes: Conjunctivae and lids are normal. Right eye exhibits no discharge. Left eye exhibits no discharge. No  scleral icterus.  Cardiovascular: Normal rate, regular rhythm and intact distal pulses. Exam reveals no gallop and no friction rub.  Murmur heard. Pulmonary/Chest: Effort normal and breath sounds normal. No stridor. No respiratory distress. She has no wheezes. She has no rales. She exhibits no tenderness.  Musculoskeletal: Normal range of motion.  Neurological: She is alert and oriented to person, place, and time.  Skin: Skin is warm, dry and intact. Capillary refill takes less than 2 seconds. No rash noted. She is not diaphoretic. No erythema. No pallor.  Psychiatric: She has a normal mood and affect. Her speech  is normal and behavior is normal. Judgment and thought content normal. Cognition and memory are normal.  Nursing note and vitals reviewed.      Assessment & Plan:   Problem List Items Addressed This Visit      Cardiovascular and Mediastinum   HTN (hypertension) - Primary   Relevant Medications   lisinopril (PRINIVIL,ZESTRIL) 5 MG tablet   Other Relevant Orders   CBC with Differential/Platelet   Comprehensive metabolic panel   TSH   UA/M w/rflx Culture, Routine   Microalbumin, Urine Waived     Respiratory   Mild intermittent asthma    Stable. Does not have inhaler. Rx for albuterol given today.      Relevant Medications   albuterol (PROVENTIL HFA;VENTOLIN HFA) 108 (90 Base) MCG/ACT inhaler     Other   Morbid obesity ([place])    Has managed to lose about 100lbs on her own with a lot of effort. Would like to see a nutritionist. Referral made today. Will check labs. Await results.       Relevant Orders   Bayer DCA Hb A1c Waived   Comprehensive metabolic panel   TSH   UA/M w/rflx Culture, Routine   Amb ref to Medical Nutrition Therapy-MNT    Other Visit Diagnoses    History of anemia       Rechecking levels today. Await results. Call with any concerns.    Relevant Orders   CBC with Differential/Platelet   Comprehensive metabolic panel   Iron and TIBC   Ferritin     Screening for cholesterol level       Labs drawn today. Await results.    Relevant Orders   Comprehensive metabolic panel   Lipid Panel w/o Chol/HDL Ratio   Screening for breast cancer       Mammogram ordered today.   Relevant Orders   MM DIGITAL SCREENING BILATERAL       Follow up plan: Return in about 1 month (around 04/08/2018) for Physical - records release from docto from 2014 please.

## 2018-03-11 NOTE — Assessment & Plan Note (Signed)
Has managed to lose about 100lbs on her own with a lot of effort. Would like to see a nutritionist. Referral made today. Will check labs. Await results.

## 2018-03-11 NOTE — Assessment & Plan Note (Signed)
Stable. Does not have inhaler. Rx for albuterol given today.

## 2018-03-11 NOTE — Patient Instructions (Addendum)
[facility] at [facility]  Address: [address] [place], [place], [place] [address]  Phone: [phone] Menopause Menopause is the normal time of life when menstrual periods stop completely. Menopause is complete when you have missed 12 consecutive menstrual periods. It usually occurs between the ages of 16 years and 63 years. Very rarely does a woman develop menopause before the age of 58 years. At menopause, your ovaries stop producing the female hormones estrogen and progesterone. This can cause undesirable symptoms and also affect your health. Sometimes the symptoms may occur 4-5 years before the menopause begins. There is no relationship between menopause and:  Oral contraceptives.  Number of children you had.  Race.  The age your menstrual periods started (menarche).  Heavy smokers and very thin women may develop menopause earlier in life. What are the causes?  The ovaries stop producing the female hormones estrogen and progesterone. Other causes include:  Surgery to remove both ovaries.  The ovaries stop functioning for no known reason.  Tumors of the pituitary gland in the brain.  Medical disease that affects the ovaries and hormone production.  Radiation treatment to the abdomen or pelvis.  Chemotherapy that affects the ovaries.  What are the signs or symptoms?  Hot flashes.  Night sweats.  Decrease in sex drive.  Vaginal dryness and thinning of the vagina causing painful intercourse.  Dryness of the skin and developing wrinkles.  Headaches.  Tiredness.  Irritability.  Memory problems.  Weight gain.  Bladder infections.  Hair growth of the face and chest.  Infertility. More serious symptoms include:  Loss of bone (osteoporosis) causing breaks (fractures).  Depression.  Hardening and narrowing of the arteries (atherosclerosis) causing heart attacks and strokes.  How is this diagnosed?  When the menstrual periods have  stopped for 12 straight months.  Physical exam.  Hormone studies of the blood. How is this treated? There are many treatment choices and nearly as many questions about them. The decisions to treat or not to treat menopausal changes is an individual choice made with your health care provider. Your health care provider can discuss the treatments with you. Together, you can decide which treatment will work best for you. Your treatment choices may include:  Hormone therapy (estrogen and progesterone).  Non-hormonal medicines.  Treating the individual symptoms with medicine (for example antidepressants for depression).  Herbal medicines that may help specific symptoms.  Counseling by a psychiatrist or psychologist.  Group therapy.  Lifestyle changes including: ? Eating healthy. ? Regular exercise. ? Limiting caffeine and alcohol. ? Stress management and meditation.  No treatment.  Follow these instructions at home:  Take the medicine your health care provider gives you as directed.  Get plenty of sleep and rest.  Exercise regularly.  Eat a diet that contains calcium (good for the bones) and soy products (acts like estrogen hormone).  Avoid alcoholic beverages.  Do not smoke.  If you have hot flashes, dress in layers.  Take supplements, calcium, and vitamin D to strengthen bones.  You can use over-the-counter lubricants or moisturizers for vaginal dryness.  Group therapy is sometimes very helpful.  Acupuncture may be helpful in some cases. Contact a health care provider if:  You are not sure you are in menopause.  You are having menopausal symptoms and need advice and treatment.  You are still having menstrual periods after age 57 years.  You have pain with intercourse.  Menopause is complete (no menstrual period for 12 months) and you  develop vaginal bleeding.  You need a referral to a specialist (gynecologist, psychiatrist, or psychologist) for  treatment. Get help right away if:  You have severe depression.  You have excessive vaginal bleeding.  You fell and think you have a broken bone.  You have pain when you urinate.  You develop leg or chest pain.  You have a fast pounding heart beat (palpitations).  You have severe headaches.  You develop vision problems.  You feel a lump in your breast.  You have abdominal pain or severe indigestion. This information is not intended to replace advice given to you by your health care provider. Make sure you discuss any questions you have with your health care provider. Document Released: 01/12/2004 Document Revised: 03/29/2016 Document Reviewed: 05/21/2013 Elsevier Interactive Patient Education  2017 [place] Maintenance for Postmenopausal Women Menopause is a normal process in which your reproductive ability comes to an end. This process happens gradually over a span of months to years, usually between the ages of 57 and 39. Menopause is complete when you have missed 12 consecutive menstrual periods. It is important to talk with your health care provider about some of the most common conditions that affect postmenopausal women, such as heart disease, cancer, and bone loss (osteoporosis). Adopting a healthy lifestyle and getting preventive care can help to promote your health and wellness. Those actions can also lower your chances of developing some of these common conditions. What should I know about menopause? During menopause, you may experience a number of symptoms, such as:  Moderate-to-severe hot flashes.  Night sweats.  Decrease in sex drive.  Mood swings.  Headaches.  Tiredness.  Irritability.  Memory problems.  Insomnia.  Choosing to treat or not to treat menopausal changes is an individual decision that you make with your health care provider. What should I know about hormone replacement therapy and supplements? Hormone therapy products are  effective for treating symptoms that are associated with menopause, such as hot flashes and night sweats. Hormone replacement carries certain risks, especially as you become older. If you are thinking about using estrogen or estrogen with progestin treatments, discuss the benefits and risks with your health care provider. What should I know about heart disease and stroke? Heart disease, heart attack, and stroke become more likely as you age. This may be due, in part, to the hormonal changes that your body experiences during menopause. These can affect how your body processes dietary fats, triglycerides, and cholesterol. Heart attack and stroke are both medical emergencies. There are many things that you can do to help prevent heart disease and stroke:  Have your blood pressure checked at least every 1-2 years. High blood pressure causes heart disease and increases the risk of stroke.  If you are 48-72 years old, ask your health care provider if you should take aspirin to prevent a heart attack or a stroke.  Do not use any tobacco products, including cigarettes, chewing tobacco, or electronic cigarettes. If you need help quitting, ask your health care provider.  It is important to eat a healthy diet and maintain a healthy weight. ? Be sure to include plenty of vegetables, fruits, low-fat dairy products, and lean protein. ? Avoid eating foods that are high in solid fats, added sugars, or salt (sodium).  Get regular exercise. This is one of the most important things that you can do for your health. ? Try to exercise for at least 150 minutes each week. The type of exercise that  you do should increase your heart rate and make you sweat. This is known as moderate-intensity exercise. ? Try to do strengthening exercises at least twice each week. Do these in addition to the moderate-intensity exercise.  Know your numbers.Ask your health care provider to check your cholesterol and your blood glucose.  Continue to have your blood tested as directed by your health care provider.  What should I know about cancer screening? There are several types of cancer. Take the following steps to reduce your risk and to catch any cancer development as early as possible. Breast Cancer  Practice breast self-awareness. ? This means understanding how your breasts normally appear and feel. ? It also means doing regular breast self-exams. Let your health care provider know about any changes, no matter how small.  If you are 27 or older, have a clinician do a breast exam (clinical breast exam or CBE) every year. Depending on your age, family history, and medical history, it may be recommended that you also have a yearly breast X-ray (mammogram).  If you have a family history of breast cancer, talk with your health care provider about genetic screening.  If you are at high risk for breast cancer, talk with your health care provider about having an MRI and a mammogram every year.  Breast cancer (BRCA) gene test is recommended for women who have family members with BRCA-related cancers. Results of the assessment will determine the need for genetic counseling and BRCA1 and for BRCA2 testing. BRCA-related cancers include these types: ? Breast. This occurs in males or females. ? Ovarian. ? Tubal. This may also be called fallopian tube cancer. ? Cancer of the abdominal or pelvic lining (peritoneal cancer). ? Prostate. ? Pancreatic.  Cervical, Uterine, and Ovarian Cancer Your health care provider may recommend that you be screened regularly for cancer of the pelvic organs. These include your ovaries, uterus, and vagina. This screening involves a pelvic exam, which includes checking for microscopic changes to the surface of your cervix (Pap test).  For women ages 21-65, health care providers may recommend a pelvic exam and a Pap test every three years. For women ages 42-65, they may recommend the Pap test and pelvic  exam, combined with testing for human papilloma virus (HPV), every five years. Some types of HPV increase your risk of cervical cancer. Testing for HPV may also be done on women of any age who have unclear Pap test results.  Other health care providers may not recommend any screening for nonpregnant women who are considered low risk for pelvic cancer and have no symptoms. Ask your health care provider if a screening pelvic exam is right for you.  If you have had past treatment for cervical cancer or a condition that could lead to cancer, you need Pap tests and screening for cancer for at least 20 years after your treatment. If Pap tests have been discontinued for you, your risk factors (such as having a new sexual partner) need to be reassessed to determine if you should start having screenings again. Some women have medical problems that increase the chance of getting cervical cancer. In these cases, your health care provider may recommend that you have screening and Pap tests more often.  If you have a family history of uterine cancer or ovarian cancer, talk with your health care provider about genetic screening.  If you have vaginal bleeding after reaching menopause, tell your health care provider.  There are currently no reliable tests available to screen  for ovarian cancer.  Lung Cancer Lung cancer screening is recommended for adults 43-73 years old who are at high risk for lung cancer because of a history of smoking. A yearly low-dose CT scan of the lungs is recommended if you:  Currently smoke.  Have a history of at least 30 pack-years of smoking and you currently smoke or have quit within the past 15 years. A pack-year is smoking an average of one pack of cigarettes per day for one year.  Yearly screening should:  Continue until it has been 15 years since you quit.  Stop if you develop a health problem that would prevent you from having lung cancer treatment.  Colorectal  Cancer  This type of cancer can be detected and can often be prevented.  Routine colorectal cancer screening usually begins at age 25 and continues through age 36.  If you have risk factors for colon cancer, your health care provider may recommend that you be screened at an earlier age.  If you have a family history of colorectal cancer, talk with your health care provider about genetic screening.  Your health care provider may also recommend using home test kits to check for hidden blood in your stool.  A small camera at the end of a tube can be used to examine your colon directly (sigmoidoscopy or colonoscopy). This is done to check for the earliest forms of colorectal cancer.  Direct examination of the colon should be repeated every 5-10 years until age 77. However, if early forms of precancerous polyps or small growths are found or if you have a family history or genetic risk for colorectal cancer, you may need to be screened more often.  Skin Cancer  Check your skin from head to toe regularly.  Monitor any moles. Be sure to tell your health care provider: ? About any new moles or changes in moles, especially if there is a change in a mole's shape or color. ? If you have a mole that is larger than the size of a pencil eraser.  If any of your family members has a history of skin cancer, especially at a young age, talk with your health care provider about genetic screening.  Always use sunscreen. Apply sunscreen liberally and repeatedly throughout the day.  Whenever you are outside, protect yourself by wearing long sleeves, pants, a wide-brimmed hat, and sunglasses.  What should I know about osteoporosis? Osteoporosis is a condition in which bone destruction happens more quickly than new bone creation. After menopause, you may be at an increased risk for osteoporosis. To help prevent osteoporosis or the bone fractures that can happen because of osteoporosis, the following is  recommended:  If you are 90-84 years old, get at least 1,000 mg of calcium and at least 600 mg of vitamin D per day.  If you are older than age 66 but younger than age 33, get at least 1,200 mg of calcium and at least 600 mg of vitamin D per day.  If you are older than age 48, get at least 1,200 mg of calcium and at least 800 mg of vitamin D per day.  Smoking and excessive alcohol intake increase the risk of osteoporosis. Eat foods that are rich in calcium and vitamin D, and do weight-bearing exercises several times each week as directed by your health care provider. What should I know about how menopause affects my mental health? Depression may occur at any age, but it is more common as you become  older. Common symptoms of depression include:  Low or sad mood.  Changes in sleep patterns.  Changes in appetite or eating patterns.  Feeling an overall lack of motivation or enjoyment of activities that you previously enjoyed.  Frequent crying spells.  Talk with your health care provider if you think that you are experiencing depression. What should I know about immunizations? It is important that you get and maintain your immunizations. These include:  Tetanus, diphtheria, and pertussis (Tdap) booster vaccine.  Influenza every year before the flu season begins.  Pneumonia vaccine.  Shingles vaccine.  Your health care provider may also recommend other immunizations. This information is not intended to replace advice given to you by your health care provider. Make sure you discuss any questions you have with your health care provider. Document Released: 12/14/2005 Document Revised: 05/11/2016 Document Reviewed: 07/26/2015 Elsevier Interactive Patient Education  2018 [employer].

## 2018-03-12 ENCOUNTER — Telehealth: Payer: Self-pay | Admitting: Family Medicine

## 2018-03-12 DIAGNOSIS — E611 Iron deficiency: Secondary | ICD-10-CM

## 2018-03-12 LAB — CBC WITH DIFFERENTIAL/PLATELET
BASOS ABS: 0 10*3/uL (ref 0.0–0.2)
Basos: 0 %
EOS (ABSOLUTE): 0.2 10*3/uL (ref 0.0–0.4)
Eos: 4 %
Hematocrit: 36.9 % (ref 34.0–46.6)
Hemoglobin: 11.5 g/dL (ref 11.1–15.9)
IMMATURE GRANS (ABS): 0 10*3/uL (ref 0.0–0.1)
Immature Granulocytes: 0 %
LYMPHS: 25 %
Lymphocytes Absolute: 1.3 10*3/uL (ref 0.7–3.1)
MCH: 22.4 pg — AB (ref 26.6–33.0)
MCHC: 31.2 g/dL — ABNORMAL LOW (ref 31.5–35.7)
MCV: 72 fL — ABNORMAL LOW (ref 79–97)
MONOS ABS: 0.3 10*3/uL (ref 0.1–0.9)
Monocytes: 6 %
Neutrophils Absolute: 3.5 10*3/uL (ref 1.4–7.0)
Neutrophils: 65 %
PLATELETS: 252 10*3/uL (ref 150–379)
RBC: 5.14 x10E6/uL (ref 3.77–5.28)
RDW: 17.5 % — AB (ref 12.3–15.4)
WBC: 5.3 10*3/uL (ref 3.4–10.8)

## 2018-03-12 LAB — IRON AND TIBC
Iron Saturation: 9 % — CL (ref 15–55)
Iron: 32 ug/dL (ref 27–159)
Total Iron Binding Capacity: 367 ug/dL (ref 250–450)
UIBC: 335 ug/dL (ref 131–425)

## 2018-03-12 LAB — LIPID PANEL W/O CHOL/HDL RATIO
Cholesterol, Total: 121 mg/dL (ref 100–199)
HDL: 29 mg/dL — ABNORMAL LOW (ref >39)
LDL CALC: 81 mg/dL (ref 0–99)
TRIGLYCERIDES: 57 mg/dL (ref 0–149)
VLDL Cholesterol Cal: 11 mg/dL (ref 5–40)

## 2018-03-12 LAB — COMPREHENSIVE METABOLIC PANEL
ALK PHOS: 74 IU/L (ref 39–117)
ALT: 10 IU/L (ref 0–32)
AST: 12 IU/L (ref 0–40)
Albumin/Globulin Ratio: 1.4 (ref 1.2–2.2)
Albumin: 3.9 g/dL (ref 3.5–5.5)
BILIRUBIN TOTAL: 0.3 mg/dL (ref 0.0–1.2)
BUN/Creatinine Ratio: 35 — ABNORMAL HIGH (ref 9–23)
BUN: 15 mg/dL (ref 6–24)
CHLORIDE: 107 mmol/L — AB (ref 96–106)
CO2: 22 mmol/L (ref 20–29)
Calcium: 8.7 mg/dL (ref 8.7–10.2)
Creatinine, Ser: 0.43 mg/dL — ABNORMAL LOW (ref 0.57–1.00)
GFR calc non Af Amer: 116 mL/min/{1.73_m2} (ref >59)
GFR, EST AFRICAN AMERICAN: 134 mL/min/{1.73_m2} (ref >59)
GLUCOSE: 75 mg/dL (ref 65–99)
Globulin, Total: 2.8 g/dL (ref 1.5–4.5)
Potassium: 4.4 mmol/L (ref 3.5–5.2)
Sodium: 142 mmol/L (ref 134–144)
TOTAL PROTEIN: 6.7 g/dL (ref 6.0–8.5)

## 2018-03-12 LAB — TSH: TSH: 1.96 u[IU]/mL (ref 0.450–4.500)

## 2018-03-12 LAB — FERRITIN: Ferritin: 8 ng/mL — ABNORMAL LOW (ref 15–150)

## 2018-03-12 MED ORDER — FERROUS SULFATE 325 (65 FE) MG PO TABS: 325.0000 mg | ORAL_TABLET | Freq: Two times a day (BID) | ORAL | 3 refills | Status: DC

## 2018-03-12 NOTE — Telephone Encounter (Signed)
Called patient, no answer, left a message for patient to return my call. OK for [facility] nurse triage to give patient lab results.

## 2018-03-12 NOTE — Telephone Encounter (Signed)
Please let her know that all her labs look really good! No high cholesterol or diabetes and her kidneys look great. Her iron is a bit low- but a lot better than it has been. I've sent through an oral iron supplement to her pharmacy. I'd like her to take it 2x a day, and we'll recheck her when she comes back for her physical. If it doesn't help, we'll get her infusions, but since she's done with her periods now, this may work this time. Thanks!

## 2018-03-24 ENCOUNTER — Encounter: Payer: Self-pay | Admitting: Family Medicine

## 2018-04-07 ENCOUNTER — Encounter: Payer: Self-pay | Admitting: Dietician

## 2018-04-07 ENCOUNTER — Encounter: Payer: Medicaid Other | Attending: Family Medicine | Admitting: Dietician

## 2018-04-07 DIAGNOSIS — I10 Essential (primary) hypertension: Secondary | ICD-10-CM | POA: Diagnosis not present

## 2018-04-07 DIAGNOSIS — Z6841 Body Mass Index (BMI) 40.0 and over, adult: Secondary | ICD-10-CM | POA: Diagnosis not present

## 2018-04-07 DIAGNOSIS — Z713 Dietary counseling and surveillance: Secondary | ICD-10-CM | POA: Insufficient documentation

## 2018-04-07 NOTE — Progress Notes (Signed)
Medical Nutrition Therapy: Visit start time: 1450  end time:1550 Assessment:  Diagnosis: morbid obesity Past medical history: hypertension, anemia Psychosocial issues/ stress concerns: Patient rates her stress as low and indicates "very well" as to how well she deals with her stress  Preferred learning method:  . Auditory . Visual . Hands-on Current weight: 352.8 lbs Height: 67 in Medications, supplements: see list Progress and evaluation:  Patient in for initial medical nutrition therapy visit. She reports that she started changing eating habits 1/'18 when her weight was 446 lbs and based on today's weight, she has lost 94 lbs. She reports that both she and her boyfriend who lives in [place] have both made diet changes and are supporting each other in this effort. Prior to the changes, she reports she ate mainly "take out" fast foods such as pizza, etc. She rarely ate fruits and vegetables. She is now eating 3 meals per day and has added some vegetables to her diet. She has switched to whole wheat bread, brown rice and ww pasta. Her intake of calcium sources and fruit is low. She dislikes water and adds Crystal lite packets; she drinks no sugar sweetened beverages. She states she just wants more guidance in her efforts to eat healthier and continue to lose weight.  She sleeps until 11:00 or 12:00pm and so eats at irregular times. She states she doesn't see this changing. "I've done this for years".  Physical activity: Uses bands to do some resistance exercises on most days for 20 minutes.  Dietary Intake:  Usual eating pattern includes 63meals and 1-2 snacks per day. Dining out frequency: 0-1 meal per week.  Breakfast: 12:00pm- 2 cups coffee, small bagel with ham/cheese; grills on a [employer] Lunch:6:00pm- grilled chicken or hamburger pattie, rice or small potato, squash or okra (usually fried) or green beans Supper: 11:30-12:00- meat sandwich with veggie straws Bedtime is  2-2:30am  Beverages: coffee, water with sugar free flavored pkts.  Nutrition Care Education: Weight control: benefits of weight control, identifying healthy weight, determining reasonable weight goal, behavioral changes for weight loss Commended on her efforts and success thus far. Discussed and showed how to fine tune what she is presently doing to begin to better meet basic nutrient needs. Use food guide plate to discuss and show balance of protein foods,carbohydrate and non-starchy vegetables. Gave and reviewed sample menus. Also, commended on her resistance exercises she is doing.Gave a handout showing additional armchair exercises for cardio. Also, gave a coupon for the [employer] exercise program at [facility].  Nutritional Diagnosis:  [place]-3.3 Overweight/obesity As related to past history of mainly a "fast food" diet and lack of exercise.  As evidenced by diet history and BMI of 55.  Intervention: Balance meals with protein (2-4 oz), 2-4 servings of carbohydrate and non-starchy vegetables. Refer to food guide plate.  Add fruit more often as part of your carbohydrate. Include yogurt or 2% milk daily in addition to low fat cheese 1-2 times daily. Add as many non-starchy vegetables as possible. Call [place]- Use coupon  Education Materials given:  . Plate Planner . Food lists/ Planning A Balanced Meal . Sample meal pattern/ menus . Armchair exercise handout . [employer] Chief Strategy Officer. . Goals/ instructions  Learner/ who was taught:  . Patient  Level of understanding: . Partial understanding; needs review/ practice Demonstrated degree of understanding via:   Teach back Learning barriers: . None Willingness to learn/ readiness for change: . Eager, change in progress  Monitoring and Evaluation:  Dietary intake, exercise,  and body weight      follow up: 05/05/18 at 1:15

## 2018-04-07 NOTE — Patient Instructions (Signed)
Balance meals with protein (2-4 oz), 2-4 servings of carbohydrate and non-starchy vegetables. Refer to food guide plate.  Add fruit more often as part of your carbohydrate. Include yogurt or 2% milk daily in addition to low fat cheese 1-2 times daily. Add as many non-starchy vegetables as possible. Call [place]- Use coupon

## 2018-04-11 ENCOUNTER — Ambulatory Visit: Payer: Medicaid Other | Admitting: Family Medicine

## 2018-04-11 ENCOUNTER — Encounter: Payer: Self-pay | Admitting: Family Medicine

## 2018-04-11 ENCOUNTER — Ambulatory Visit (INDEPENDENT_AMBULATORY_CARE_PROVIDER_SITE_OTHER): Payer: Medicaid Other | Admitting: Family Medicine

## 2018-04-11 VITALS — BP 165/113 | HR 97 | Temp 98.3°F | Ht 67.0 in | Wt 351.1 lb

## 2018-04-11 DIAGNOSIS — Z0000 Encounter for general adult medical examination without abnormal findings: Secondary | ICD-10-CM

## 2018-04-11 DIAGNOSIS — Z114 Encounter for screening for human immunodeficiency virus [HIV]: Secondary | ICD-10-CM | POA: Diagnosis not present

## 2018-04-11 DIAGNOSIS — Z23 Encounter for immunization: Secondary | ICD-10-CM | POA: Diagnosis not present

## 2018-04-11 DIAGNOSIS — Z124 Encounter for screening for malignant neoplasm of cervix: Secondary | ICD-10-CM

## 2018-04-11 DIAGNOSIS — L987 Excessive and redundant skin and subcutaneous tissue: Secondary | ICD-10-CM | POA: Diagnosis not present

## 2018-04-11 DIAGNOSIS — Z1159 Encounter for screening for other viral diseases: Secondary | ICD-10-CM | POA: Diagnosis not present

## 2018-04-11 DIAGNOSIS — I10 Essential (primary) hypertension: Secondary | ICD-10-CM | POA: Diagnosis not present

## 2018-04-11 DIAGNOSIS — B351 Tinea unguium: Secondary | ICD-10-CM

## 2018-04-11 MED ORDER — CICLOPIROX 8 % EX SOLN: CUTANEOUS | 0 refills | Status: AC

## 2018-04-11 MED ORDER — LISINOPRIL 10 MG PO TABS: 10.0000 mg | ORAL_TABLET | Freq: Every day | ORAL | 3 refills | Status: DC

## 2018-04-11 NOTE — Patient Instructions (Addendum)
[facility]  Address: [address] [place], [place], [place] [address]  Phone: [phone]   Health Maintenance for Postmenopausal Women Menopause is a normal process in which your reproductive ability comes to an end. This process happens gradually over a span of months to years, usually between the ages of 68 and 80. Menopause is complete when you have missed 12 consecutive menstrual periods. It is important to talk with your health care provider about some of the most common conditions that affect postmenopausal women, such as heart disease, cancer, and bone loss (osteoporosis). Adopting a healthy lifestyle and getting preventive care can help to promote your health and wellness. Those actions can also lower your chances of developing some of these common conditions. What should I know about menopause? During menopause, you may experience a number of symptoms, such as:  Moderate-to-severe hot flashes.  Night sweats.  Decrease in sex drive.  Mood swings.  Headaches.  Tiredness.  Irritability.  Memory problems.  Insomnia.  Choosing to treat or not to treat menopausal changes is an individual decision that you make with your health care provider. What should I know about hormone replacement therapy and supplements? Hormone therapy products are effective for treating symptoms that are associated with menopause, such as hot flashes and night sweats. Hormone replacement carries certain risks, especially as you become older. If you are thinking about using estrogen or estrogen with progestin treatments, discuss the benefits and risks with your health care provider. What should I know about heart disease and stroke? Heart disease, heart attack, and stroke become more likely as you age. This may be due, in part, to the hormonal changes that your body experiences during menopause. These can affect how your body processes dietary fats, triglycerides, and  cholesterol. Heart attack and stroke are both medical emergencies. There are many things that you can do to help prevent heart disease and stroke:  Have your blood pressure checked at least every 1-2 years. High blood pressure causes heart disease and increases the risk of stroke.  If you are 20-77 years old, ask your health care provider if you should take aspirin to prevent a heart attack or a stroke.  Do not use any tobacco products, including cigarettes, chewing tobacco, or electronic cigarettes. If you need help quitting, ask your health care provider.  It is important to eat a healthy diet and maintain a healthy weight. ? Be sure to include plenty of vegetables, fruits, low-fat dairy products, and lean protein. ? Avoid eating foods that are high in solid fats, added sugars, or salt (sodium).  Get regular exercise. This is one of the most important things that you can do for your health. ? Try to exercise for at least 150 minutes each week. The type of exercise that you do should increase your heart rate and make you sweat. This is known as moderate-intensity exercise. ? Try to do strengthening exercises at least twice each week. Do these in addition to the moderate-intensity exercise.  Know your numbers.Ask your health care provider to check your cholesterol and your blood glucose. Continue to have your blood tested as directed by your health care provider.  What should I know about cancer screening? There are several types of cancer. Take the following steps to reduce your risk and to catch any cancer development as early as possible. Breast Cancer  Practice breast self-awareness. ? This means understanding how your breasts normally appear and feel. ? It also means  doing regular breast self-exams. Let your health care provider know about any changes, no matter how small.  If you are 41 or older, have a clinician do a breast exam (clinical breast exam or CBE) every year. Depending  on your age, family history, and medical history, it may be recommended that you also have a yearly breast X-ray (mammogram).  If you have a family history of breast cancer, talk with your health care provider about genetic screening.  If you are at high risk for breast cancer, talk with your health care provider about having an MRI and a mammogram every year.  Breast cancer (BRCA) gene test is recommended for women who have family members with BRCA-related cancers. Results of the assessment will determine the need for genetic counseling and BRCA1 and for BRCA2 testing. BRCA-related cancers include these types: ? Breast. This occurs in males or females. ? Ovarian. ? Tubal. This may also be called fallopian tube cancer. ? Cancer of the abdominal or pelvic lining (peritoneal cancer). ? Prostate. ? Pancreatic.  Cervical, Uterine, and Ovarian Cancer Your health care provider may recommend that you be screened regularly for cancer of the pelvic organs. These include your ovaries, uterus, and vagina. This screening involves a pelvic exam, which includes checking for microscopic changes to the surface of your cervix (Pap test).  For women ages 21-65, health care providers may recommend a pelvic exam and a Pap test every three years. For women ages 81-65, they may recommend the Pap test and pelvic exam, combined with testing for human papilloma virus (HPV), every five years. Some types of HPV increase your risk of cervical cancer. Testing for HPV may also be done on women of any age who have unclear Pap test results.  Other health care providers may not recommend any screening for nonpregnant women who are considered low risk for pelvic cancer and have no symptoms. Ask your health care provider if a screening pelvic exam is right for you.  If you have had past treatment for cervical cancer or a condition that could lead to cancer, you need Pap tests and screening for cancer for at least 20 years after  your treatment. If Pap tests have been discontinued for you, your risk factors (such as having a new sexual partner) need to be reassessed to determine if you should start having screenings again. Some women have medical problems that increase the chance of getting cervical cancer. In these cases, your health care provider may recommend that you have screening and Pap tests more often.  If you have a family history of uterine cancer or ovarian cancer, talk with your health care provider about genetic screening.  If you have vaginal bleeding after reaching menopause, tell your health care provider.  There are currently no reliable tests available to screen for ovarian cancer.  Lung Cancer Lung cancer screening is recommended for adults 82-21 years old who are at high risk for lung cancer because of a history of smoking. A yearly low-dose CT scan of the lungs is recommended if you:  Currently smoke.  Have a history of at least 30 pack-years of smoking and you currently smoke or have quit within the past 15 years. A pack-year is smoking an average of one pack of cigarettes per day for one year.  Yearly screening should:  Continue until it has been 15 years since you quit.  Stop if you develop a health problem that would prevent you from having lung cancer treatment.  Colorectal  Cancer  This type of cancer can be detected and can often be prevented.  Routine colorectal cancer screening usually begins at age 61 and continues through age 22.  If you have risk factors for colon cancer, your health care provider may recommend that you be screened at an earlier age.  If you have a family history of colorectal cancer, talk with your health care provider about genetic screening.  Your health care provider may also recommend using home test kits to check for hidden blood in your stool.  A small camera at the end of a tube can be used to examine your colon directly (sigmoidoscopy or colonoscopy).  This is done to check for the earliest forms of colorectal cancer.  Direct examination of the colon should be repeated every 5-10 years until age 50. However, if early forms of precancerous polyps or small growths are found or if you have a family history or genetic risk for colorectal cancer, you may need to be screened more often.  Skin Cancer  Check your skin from head to toe regularly.  Monitor any moles. Be sure to tell your health care provider: ? About any new moles or changes in moles, especially if there is a change in a mole's shape or color. ? If you have a mole that is larger than the size of a pencil eraser.  If any of your family members has a history of skin cancer, especially at a young age, talk with your health care provider about genetic screening.  Always use sunscreen. Apply sunscreen liberally and repeatedly throughout the day.  Whenever you are outside, protect yourself by wearing long sleeves, pants, a wide-brimmed hat, and sunglasses.  What should I know about osteoporosis? Osteoporosis is a condition in which bone destruction happens more quickly than new bone creation. After menopause, you may be at an increased risk for osteoporosis. To help prevent osteoporosis or the bone fractures that can happen because of osteoporosis, the following is recommended:  If you are 58-61 years old, get at least 1,000 mg of calcium and at least 600 mg of vitamin D per day.  If you are older than age 65 but younger than age 38, get at least 1,200 mg of calcium and at least 600 mg of vitamin D per day.  If you are older than age 78, get at least 1,200 mg of calcium and at least 800 mg of vitamin D per day.  Smoking and excessive alcohol intake increase the risk of osteoporosis. Eat foods that are rich in calcium and vitamin D, and do weight-bearing exercises several times each week as directed by your health care provider. What should I know about how menopause affects my mental  health? Depression may occur at any age, but it is more common as you become older. Common symptoms of depression include:  Low or sad mood.  Changes in sleep patterns.  Changes in appetite or eating patterns.  Feeling an overall lack of motivation or enjoyment of activities that you previously enjoyed.  Frequent crying spells.  Talk with your health care provider if you think that you are experiencing depression. What should I know about immunizations? It is important that you get and maintain your immunizations. These include:  Tetanus, diphtheria, and pertussis (Tdap) booster vaccine.  Influenza every year before the flu season begins.  Pneumonia vaccine.  Shingles vaccine.  Your health care provider may also recommend other immunizations. This information is not intended to replace advice given to  you by your health care provider. Make sure you discuss any questions you have with your health care provider. Document Released: 12/14/2005 Document Revised: 05/11/2016 Document Reviewed: 07/26/2015 Elsevier Interactive Patient Education  2018 [place].  Fungal Nail Infection Fungal nail infection is a common fungal infection of the toenails or fingernails. This condition affects toenails more often than fingernails. More than one nail may be infected. The condition can be passed from person to person (is contagious). What are the causes? This condition is caused by a fungus. Several types of funguses can cause the infection. These funguses are common in moist and warm areas. If your hands or feet come into contact with the fungus, it may get into a crack in your fingernail or toenail and cause the infection. What increases the risk? The following factors may make you more likely to develop this condition:  Being female.  Having diabetes.  Being of older age.  Living with someone who has the fungus.  Walking barefoot in areas where the fungus thrives, such as showers or  locker rooms.  Having poor circulation.  Wearing shoes and socks that cause your feet to sweat.  Having athlete's foot.  Having a nail injury or history of a recent nail surgery.  Having psoriasis.  Having a weak body defense system (immune system).  What are the signs or symptoms? Symptoms of this condition include:  A pale spot on the nail.  Thickening of the nail.  A nail that becomes yellow or brown.  A brittle or ragged nail edge.  A crumbling nail.  A nail that has lifted away from the nail bed.  How is this diagnosed? This condition is diagnosed with a physical exam. Your health care provider may take a scraping or clipping from your nail to test for the fungus. How is this treated? Mild infections do not need treatment. If you have significant nail changes, treatment may include:  Oral antifungal medicines. You may need to take the medicine for several weeks or several months, and you may not see the results for a long time. These medicines can cause side effects. Ask your health care provider what problems to watch for.  Antifungal nail polish and nail cream. These may be used along with oral antifungal medicines.  Laser treatment of the nail.  Surgery to remove the nail. This may be needed for the most severe infections.  Treatment takes a long time, and the infection may come back. Follow these instructions at home: Medicines  Take or apply over-the-counter and prescription medicines only as told by your health care provider.  Ask your health care provider about using over-the-counter mentholated ointment on your nails. Lifestyle   Do not share personal items, such as towels or nail clippers.  Trim your nails often.  Wash and dry your hands and feet every day.  Wear absorbent socks, and change your socks frequently.  Wear shoes that allow air to circulate, such as sandals or canvas tennis shoes. Throw out old shoes.  Wear rubber gloves if you  are working with your hands in wet areas.  Do not walk barefoot in shower rooms or locker rooms.  Do not use a nail salon that does not use clean instruments.  Do not use artificial nails. General instructions  Keep all follow-up visits as told by your health care provider. This is important.  Use antifungal foot powder on your feet and in your shoes. Contact a health care provider if: Your infection is  not getting better or it is getting worse after several months. This information is not intended to replace advice given to you by your health care provider. Make sure you discuss any questions you have with your health care provider. Document Released: 10/19/2000 Document Revised: 03/29/2016 Document Reviewed: 04/25/2015 Elsevier Interactive Patient Education  2018 [employer].

## 2018-04-11 NOTE — Assessment & Plan Note (Signed)
Not under good control. Will increase to 10mg  daily and recheck 1 month. Call with any concerns.

## 2018-04-11 NOTE — Assessment & Plan Note (Signed)
Will treat with penlac. Offered referral to podiatry, which was declined.

## 2018-04-11 NOTE — Progress Notes (Signed)
BP (!) 165/113 (BP Location: Left Arm, Patient Position: Sitting, Cuff Size: Normal)   Pulse 97   Temp 98.3 F (36.8 C)   Ht 5\' 7"  (1.702 m) Comment: Patient reported  Wt (!) 351 lb 2 oz (159.3 kg)   LMP 05/29/2015   SpO2 96%   BMI 54.99 kg/m    Subjective:    Patient ID: Mackenzie Lopez, female    DOB: 10/31/1966, 52 y.o.   MRN: [id number]  HPI: Mackenzie Lopez is a 52 y.o. female presenting on 04/11/2018 for comprehensive medical examination. Current medical complaints include:  HYPERTENSION Hypertension status: uncontrolled  Satisfied with current treatment? no Duration of hypertension: chronic BP monitoring frequency:  not checking BP medication side effects:  no Medication compliance: excellent compliance Previous BP meds: lisinopril Aspirin: no Recurrent headaches: no Visual changes: no Palpitations: no Dyspnea: no Chest pain: no Lower extremity edema: no Dizzy/lightheaded: no  Menopausal Symptoms: no  Depression Screen done today and results listed below:  Depression screen [facility] 2/9 04/07/2018  Decreased Interest 0  Down, Depressed, Hopeless 0  PHQ - 2 Score 0    Past Medical History:  Past Medical History:  Diagnosis Date  . Anemia    secondary to acute blood loss from menorrhagia  . Anxiety   . Depression   . Heart murmur   . Hypertension   . Menorrhagia with irregular cycle   . Morbid obesity with BMI of 60.0-69.9, adult [facility])     Surgical History:  Past Surgical History:  Procedure Laterality Date  . ANKLE SURGERY Left 1992  . CESAREAN SECTION  1986  . TUBAL LIGATION Bilateral 2000    Medications:  Current Outpatient Medications on File Prior to Visit  Medication Sig  . albuterol (PROVENTIL HFA;VENTOLIN HFA) 108 (90 Base) MCG/ACT inhaler Inhale 2 puffs into the lungs every 6 (six) hours as needed for wheezing or shortness of breath.  . ferrous sulfate (FERROUSUL) 325 (65 FE) MG tablet Take 1 tablet (325 mg total) by mouth 2 (two) times  daily with a meal. (Patient not taking: Reported on 04/07/2018)   No current facility-administered medications on file prior to visit.     Allergies:  No Known Allergies  Social History:  Social History   Socioeconomic History  . Marital status: Legally Separated    Spouse name: Not on file  . Number of children: Not on file  . Years of education: Not on file  . Highest education level: Not on file  Occupational History  . Not on file  Social Needs  . Financial resource strain: Not on file  . Food insecurity:    Worry: Not on file    Inability: Not on file  . Transportation needs:    Medical: Not on file    Non-medical: Not on file  Tobacco Use  . Smoking status: Former Smoker    Types: Cigarettes  . Smokeless tobacco: Never Used  Substance and Sexual Activity  . Alcohol use: No  . Drug use: No  . Sexual activity: Not Currently  Lifestyle  . Physical activity:    Days per week: Not on file    Minutes per session: Not on file  . Stress: Not on file  Relationships  . Social connections:    Talks on phone: Not on file    Gets together: Not on file    Attends religious service: Not on file    Active member of club or organization: Not on file  Attends meetings of clubs or organizations: Not on file    Relationship status: Not on file  . Intimate partner violence:    Fear of current or ex partner: Not on file    Emotionally abused: Not on file    Physically abused: Not on file    Forced sexual activity: Not on file  Other Topics Concern  . Not on file  Social History Narrative  . Not on file   Social History   Tobacco Use  Smoking Status Former Smoker  . Types: Cigarettes  Smokeless Tobacco Never Used   Social History   Substance and Sexual Activity  Alcohol Use No    Family History:  Family History  Problem Relation Age of Onset  . Dementia Maternal Grandmother   . Cancer Maternal Grandmother        Lung and liver  . Alcohol abuse Maternal  Grandfather   . Heart disease Maternal Grandfather   . Heart attack Maternal Grandfather     Past medical history, surgical history, medications, allergies, family history and social history reviewed with patient today and changes made to appropriate areas of the chart.   Review of Systems  Constitutional: Negative.   HENT: Negative.   Eyes: Negative.   Respiratory: Negative.   Cardiovascular: Positive for palpitations. Negative for chest pain, orthopnea, claudication, leg swelling and PND.  Gastrointestinal: Negative.   Genitourinary: Negative.   Musculoskeletal: Negative.   Skin: Negative.   Neurological: Negative.   Endo/Heme/Allergies: Positive for environmental allergies. Negative for polydipsia. Does not bruise/bleed easily.  Psychiatric/Behavioral: Negative.     All other ROS negative except what is listed above and in the HPI.      Objective:    BP (!) 165/113 (BP Location: Left Arm, Patient Position: Sitting, Cuff Size: Normal)   Pulse 97   Temp 98.3 F (36.8 C)   Ht 5\' 7"  (1.702 m) Comment: Patient reported  Wt (!) 351 lb 2 oz (159.3 kg)   LMP 05/29/2015   SpO2 96%   BMI 54.99 kg/m   Wt Readings from Last 3 Encounters:  04/11/18 (!) 351 lb 2 oz (159.3 kg)  04/07/18 (!) 352 lb 12.8 oz (160 kg)  03/11/18 (!) 357 lb 6 oz (162.1 kg)    Physical Exam  Constitutional: She is oriented to person, place, and time. She appears well-developed and well-nourished. No distress.  Morbidly obese  HENT:  Head: Normocephalic and atraumatic.  Right Ear: Hearing, tympanic membrane, external ear and ear canal normal.  Left Ear: Hearing, tympanic membrane, external ear and ear canal normal.  Nose: Nose normal.  Mouth/Throat: Uvula is midline, oropharynx is clear and moist and mucous membranes are normal. No oropharyngeal exudate.  Eyes: Pupils are equal, round, and reactive to light. Conjunctivae, EOM and lids are normal. Right eye exhibits no discharge. Left eye exhibits no  discharge. No scleral icterus.  Neck: Normal range of motion. Neck supple. No JVD present. No tracheal deviation present. No thyromegaly present.  Cardiovascular: Normal rate, regular rhythm, normal heart sounds and intact distal pulses. Exam reveals no gallop and no friction rub.  No murmur heard. Pulmonary/Chest: Effort normal and breath sounds normal. No stridor. No respiratory distress. She has no wheezes. She has no rales. She exhibits no tenderness. Right breast exhibits no inverted nipple, no mass, no nipple discharge, no skin change and no tenderness. Left breast exhibits no inverted nipple, no mass, no nipple discharge, no skin change and no tenderness. No breast swelling, tenderness,  discharge or bleeding. Breasts are symmetrical.  Abdominal: Soft. Bowel sounds are normal. She exhibits no distension and no mass. There is no tenderness. There is no rebound and no guarding. A hernia is present. Hernia confirmed positive in the ventral area.  Genitourinary: Vagina normal and uterus normal. No vaginal discharge found.  Musculoskeletal: Normal range of motion. She exhibits no edema, tenderness or deformity.  Lymphadenopathy:    She has no cervical adenopathy.  Neurological: She is alert and oriented to person, place, and time. She displays normal reflexes. No cranial nerve deficit or sensory deficit. She exhibits normal muscle tone. Coordination normal.  Skin: Skin is warm, dry and intact. Capillary refill takes less than 2 seconds. No rash noted. She is not diaphoretic. No erythema. No pallor.  Thick and discolored toenails Significant amount of excess skin due to weight loss  Psychiatric: Her speech is normal and behavior is normal. Judgment and thought content normal. Her affect is labile. Cognition and memory are normal.  Nursing note and vitals reviewed.   Results for orders placed or performed in visit on 03/11/18  Microscopic Examination  Result Value Ref Range   WBC, UA 0-5 0 - 5  /hpf   RBC, UA 0-2 0 - 2 /hpf   Epithelial Cells (non renal) 0-10 0 - 10 /hpf   Bacteria, UA None seen None seen/Few  Bayer DCA Hb A1c Waived  Result Value Ref Range   HB A1C (BAYER DCA - WAIVED) 5.1 <7.0 %  CBC with Differential/Platelet  Result Value Ref Range   WBC 5.3 3.4 - 10.8 x10E3/uL   RBC 5.14 3.77 - 5.28 x10E6/uL   Hemoglobin 11.5 11.1 - 15.9 g/dL   Hematocrit 36.9 34.0 - 46.6 %   MCV 72 (L) 79 - 97 fL   MCH 22.4 (L) 26.6 - 33.0 pg   MCHC 31.2 (L) 31.5 - 35.7 g/dL   RDW 17.5 (H) 12.3 - 15.4 %   Platelets 252 150 - 379 x10E3/uL   Neutrophils 65 Not Estab. %   Lymphs 25 Not Estab. %   Monocytes 6 Not Estab. %   Eos 4 Not Estab. %   Basos 0 Not Estab. %   Neutrophils Absolute 3.5 1.4 - 7.0 x10E3/uL   Lymphocytes Absolute 1.3 0.7 - 3.1 x10E3/uL   Monocytes Absolute 0.3 0.1 - 0.9 x10E3/uL   EOS (ABSOLUTE) 0.2 0.0 - 0.4 x10E3/uL   Basophils Absolute 0.0 0.0 - 0.2 x10E3/uL   Immature Granulocytes 0 Not Estab. %   Immature Grans (Abs) 0.0 0.0 - 0.1 x10E3/uL  Comprehensive metabolic panel  Result Value Ref Range   Glucose 75 65 - 99 mg/dL   BUN 15 6 - 24 mg/dL   Creatinine, Ser 0.43 (L) 0.57 - 1.00 mg/dL   GFR calc non Af Amer 116 >59 mL/min/1.73   GFR calc Af Amer 134 >59 mL/min/1.73   BUN/Creatinine Ratio 35 (H) 9 - 23   Sodium 142 134 - 144 mmol/L   Potassium 4.4 3.5 - 5.2 mmol/L   Chloride 107 (H) 96 - 106 mmol/L   CO2 22 20 - 29 mmol/L   Calcium 8.7 8.7 - 10.2 mg/dL   Total Protein 6.7 6.0 - 8.5 g/dL   Albumin 3.9 3.5 - 5.5 g/dL   Globulin, Total 2.8 1.5 - 4.5 g/dL   Albumin/Globulin Ratio 1.4 1.2 - 2.2   Bilirubin Total 0.3 0.0 - 1.2 mg/dL   Alkaline Phosphatase 74 39 - 117 IU/L   AST 12 0 -  40 IU/L   ALT 10 0 - 32 IU/L  Lipid Panel w/o Chol/HDL Ratio  Result Value Ref Range   Cholesterol, Total 121 100 - 199 mg/dL   Triglycerides 57 0 - 149 mg/dL   HDL 29 (L) >39 mg/dL   VLDL Cholesterol Cal 11 5 - 40 mg/dL   LDL Calculated 81 0 - 99 mg/dL  TSH    Result Value Ref Range   TSH 1.960 0.450 - 4.500 uIU/mL  UA/M w/rflx Culture, Routine  Result Value Ref Range   Specific Gravity, UA 1.025 1.005 - 1.030   pH, UA 5.5 5.0 - 7.5   Color, UA Yellow Yellow   Appearance Ur Cloudy (A) Clear   Leukocytes, UA Trace (A) Negative   Protein, UA Negative Negative/Trace   Glucose, UA Negative Negative   Ketones, UA Negative Negative   RBC, UA Negative Negative   Bilirubin, UA Negative Negative   Urobilinogen, Ur 0.2 0.2 - 1.0 mg/dL   Nitrite, UA Negative Negative   Microscopic Examination See below:   Microalbumin, Urine Waived  Result Value Ref Range   Microalb, Ur Waived 30 (H) 0 - 19 mg/L   Creatinine, Urine Waived 200 10 - 300 mg/dL   Microalb/Creat Ratio <30 <30 mg/g  Iron and TIBC  Result Value Ref Range   Total Iron Binding Capacity 367 250 - 450 ug/dL   UIBC 335 131 - 425 ug/dL   Iron 32 27 - 159 ug/dL   Iron Saturation 9 (LL) 15 - 55 %  Ferritin  Result Value Ref Range   Ferritin 8 (L) 15 - 150 ng/mL      Assessment & Plan:   Problem List Items Addressed This Visit      Cardiovascular and Mediastinum   HTN (hypertension)    Not under good control. Will increase to 10mg  daily and recheck 1 month. Call with any concerns.       Relevant Medications   lisinopril (PRINIVIL,ZESTRIL) 10 MG tablet   Other Relevant Orders   Basic metabolic panel     Musculoskeletal and Integument   Nail fungus    Will treat with penlac. Offered referral to podiatry, which was declined.       Relevant Medications   ciclopirox (PENLAC) 8 % solution    Other Visit Diagnoses    Routine general medical examination at a health care facility    -  Primary   Vaccines updated. Screening labs checked last visit. Pap done today. Mammogram ordered. colonoscopy refused. Continue diet and exercise.    Excess skin       Referral to plastic surgeon made for evaluation.    Relevant Orders   Ambulatory referral to Plastic Surgery   Encounter for  hepatitis C screening test for low risk patient       Labs drawn today. Await results.    Relevant Orders   Hepatitis C Antibody   Screening for HIV without presence of risk factors       Labs drawn today. Await results.    Relevant Orders   HIV antibody   Immunization due       Tdap and pneumovax given today.   Relevant Orders   Tdap vaccine greater than or equal to 7yo IM   Pneumococcal polysaccharide vaccine 23-valent greater than or equal to 2yo subcutaneous/IM   Screening for cervical cancer       Pap done today. Await results.    Relevant Orders   IGP, Aptima HPV,  rfx 16/18,45       Follow up plan: Return in about 1 month (around 05/09/2018) for follow up BP.   LABORATORY TESTING:  - Pap smear: pap done  IMMUNIZATIONS:   - Tdap: Tetanus vaccination status reviewed: Tdap vaccination indicated and given today. - Influenza: Postponed to flu season - Pneumovax: Administered today  SCREENING: -Mammogram: Order in and scheduled  - Colonoscopy: Refused   PATIENT COUNSELING:   Advised to take 1 mg of folate supplement per day if capable of pregnancy.   Sexuality: Discussed sexually transmitted diseases, partner selection, use of condoms, avoidance of unintended pregnancy  and contraceptive alternatives.   Advised to avoid cigarette smoking.  I discussed with the patient that most people either abstain from alcohol or drink within safe limits (<=14/week and <=4 drinks/occasion for males, <=7/weeks and <= 3 drinks/occasion for females) and that the risk for alcohol disorders and other health effects rises proportionally with the number of drinks per week and how often a drinker exceeds daily limits.  Discussed cessation/primary prevention of drug use and availability of treatment for abuse.   Diet: Encouraged to adjust caloric intake to maintain  or achieve ideal body weight, to reduce intake of dietary saturated fat and total fat, to limit sodium intake by avoiding high  sodium foods and not adding table salt, and to maintain adequate dietary potassium and calcium preferably from fresh fruits, vegetables, and low-fat dairy products.    stressed the importance of regular exercise  Injury prevention: Discussed safety belts, safety helmets, smoke detector, smoking near bedding or upholstery.   Dental health: Discussed importance of regular tooth brushing, flossing, and dental visits.    NEXT PREVENTATIVE PHYSICAL DUE IN 1 YEAR. Return in about 1 month (around 05/09/2018) for follow up BP.

## 2018-04-12 LAB — BASIC METABOLIC PANEL
BUN/Creatinine Ratio: 33 — ABNORMAL HIGH (ref 9–23)
BUN: 18 mg/dL (ref 6–24)
CALCIUM: 9.2 mg/dL (ref 8.7–10.2)
CHLORIDE: 106 mmol/L (ref 96–106)
CO2: 19 mmol/L — AB (ref 20–29)
Creatinine, Ser: 0.54 mg/dL — ABNORMAL LOW (ref 0.57–1.00)
GFR calc Af Amer: 125 mL/min/{1.73_m2} (ref >59)
GFR, EST NON AFRICAN AMERICAN: 108 mL/min/{1.73_m2} (ref >59)
GLUCOSE: 78 mg/dL (ref 65–99)
POTASSIUM: 4.7 mmol/L (ref 3.5–5.2)
Sodium: 142 mmol/L (ref 134–144)

## 2018-04-12 LAB — HEPATITIS C ANTIBODY

## 2018-04-12 LAB — HIV ANTIBODY (ROUTINE TESTING W REFLEX): HIV Screen 4th Generation wRfx: NONREACTIVE

## 2018-04-16 LAB — IGP, APTIMA HPV, RFX 16/18,45
HPV APTIMA: NEGATIVE
PAP Smear Comment: 0

## 2018-04-17 ENCOUNTER — Ambulatory Visit
Admission: RE | Admit: 2018-04-17 | Discharge: 2018-04-17 | Disposition: A | Discharge status: Home / Self Care | Payer: Medicaid Other | Source: Ambulatory Visit | Attending: Family Medicine | Admitting: Family Medicine

## 2018-04-17 DIAGNOSIS — Z1239 Encounter for other screening for malignant neoplasm of breast: Secondary | ICD-10-CM

## 2018-04-17 DIAGNOSIS — Z1231 Encounter for screening mammogram for malignant neoplasm of breast: Secondary | ICD-10-CM | POA: Insufficient documentation

## 2018-05-05 ENCOUNTER — Ambulatory Visit: Payer: Medicaid Other | Admitting: Dietician

## 2018-05-13 ENCOUNTER — Encounter: Payer: Self-pay | Admitting: Family Medicine

## 2018-05-13 ENCOUNTER — Ambulatory Visit: Payer: Medicaid Other | Admitting: Family Medicine

## 2018-05-13 ENCOUNTER — Other Ambulatory Visit: Payer: Self-pay

## 2018-05-13 VITALS — BP 171/107 | HR 85 | Temp 98.0°F | Ht 67.0 in | Wt 357.4 lb

## 2018-05-13 DIAGNOSIS — I10 Essential (primary) hypertension: Secondary | ICD-10-CM

## 2018-05-13 MED ORDER — HYDROXYZINE HCL 25 MG PO TABS: 25.0000 mg | ORAL_TABLET | Freq: Three times a day (TID) | ORAL | 0 refills | Status: AC | PRN

## 2018-05-13 MED ORDER — FERROUS SULFATE 325 (65 FE) MG PO TABS: 325.0000 mg | ORAL_TABLET | Freq: Two times a day (BID) | ORAL | 3 refills | Status: AC

## 2018-05-13 MED ORDER — LISINOPRIL-HYDROCHLOROTHIAZIDE 20-25 MG PO TABS
1.0000 | ORAL_TABLET | Freq: Every day | ORAL | 3 refills | Status: DC
Start: 2018-05-13 — End: 2018-05-13

## 2018-05-13 MED ORDER — LISINOPRIL-HYDROCHLOROTHIAZIDE 20-25 MG PO TABS: 1.0000 | ORAL_TABLET | Freq: Every day | ORAL | 1 refills | Status: AC

## 2018-05-13 NOTE — Assessment & Plan Note (Signed)
Not under good control. Will increase to 20mg  lisinopril and add 25mg  of HCTZ and recheck 1 month. Call with any concerns.

## 2018-05-13 NOTE — Progress Notes (Signed)
BP (!) 171/107   Pulse 85   Temp 98 F (36.7 C) (Oral)   Ht 5\' 7"  (1.702 m)   Wt (!) 357 lb 6.4 oz (162.1 kg)   LMP 05/29/2015   SpO2 97%   BMI 55.98 kg/m    Subjective:    Patient ID: Mackenzie Lopez, female    DOB: January 19, 1966, 52 y.o.   MRN: [id number]  HPI: Mackenzie Lopez is a 52 y.o. female  Chief Complaint  Patient presents with  . Hypertension   HYPERTENSION Hypertension status: uncontrolled  Satisfied with current treatment? no Duration of hypertension: chronic BP monitoring frequency:  not checking BP medication side effects:  no Medication compliance: good compliance Previous BP meds: lisinopril Aspirin: no Recurrent headaches: no Visual changes: no Palpitations: no Dyspnea: no Chest pain: no Lower extremity edema: no Dizzy/lightheaded: no  Relevant past medical, surgical, family and social history reviewed and updated as indicated. Interim medical history since our last visit reviewed. Allergies and medications reviewed and updated.  Review of Systems  Constitutional: Negative.   Respiratory: Negative.   Cardiovascular: Negative.   Neurological: Negative.   Psychiatric/Behavioral: Positive for agitation. Negative for behavioral problems, confusion, decreased concentration, dysphoric mood, hallucinations, self-injury, sleep disturbance and suicidal ideas. The patient is nervous/anxious. The patient is not hyperactive.     Per HPI unless specifically indicated above     Objective:    BP (!) 171/107   Pulse 85   Temp 98 F (36.7 C) (Oral)   Ht 5\' 7"  (1.702 m)   Wt (!) 357 lb 6.4 oz (162.1 kg)   LMP 05/29/2015   SpO2 97%   BMI 55.98 kg/m   Wt Readings from Last 3 Encounters:  05/13/18 (!) 357 lb 6.4 oz (162.1 kg)  04/11/18 (!) 351 lb 2 oz (159.3 kg)  04/07/18 (!) 352 lb 12.8 oz (160 kg)    Physical Exam  Constitutional: She is oriented to person, place, and time. She appears well-developed and well-nourished. No distress.  HENT:    Head: Normocephalic and atraumatic.  Right Ear: Hearing normal.  Left Ear: Hearing normal.  Nose: Nose normal.  Eyes: Conjunctivae and lids are normal. Right eye exhibits no discharge. Left eye exhibits no discharge. No scleral icterus.  Cardiovascular: Normal rate, regular rhythm, normal heart sounds and intact distal pulses. Exam reveals no gallop and no friction rub.  No murmur heard. Pulmonary/Chest: Effort normal and breath sounds normal. No stridor. No respiratory distress. She has no wheezes. She has no rales. She exhibits no tenderness.  Musculoskeletal: Normal range of motion.  Neurological: She is alert and oriented to person, place, and time.  Skin: Skin is warm, dry and intact. Capillary refill takes less than 2 seconds. No rash noted. She is not diaphoretic. No erythema. No pallor.  Psychiatric: She has a normal mood and affect. Her speech is normal and behavior is normal. Judgment and thought content normal. Cognition and memory are normal.  Nursing note and vitals reviewed.   Results for orders placed or performed in visit on 09/47/09  Basic metabolic panel  Result Value Ref Range   Glucose 78 65 - 99 mg/dL   BUN 18 6 - 24 mg/dL   Creatinine, Ser 0.54 (L) 0.57 - 1.00 mg/dL   GFR calc non Af Amer 108 >59 mL/min/1.73   GFR calc Af Amer 125 >59 mL/min/1.73   BUN/Creatinine Ratio 33 (H) 9 - 23   Sodium 142 134 - 144 mmol/L   Potassium  4.7 3.5 - 5.2 mmol/L   Chloride 106 96 - 106 mmol/L   CO2 19 (L) 20 - 29 mmol/L   Calcium 9.2 8.7 - 10.2 mg/dL  HIV antibody  Result Value Ref Range   HIV Screen 4th Generation wRfx Non Reactive Non Reactive  Hepatitis C Antibody  Result Value Ref Range   Hep C Virus Ab <0.1 0.0 - 0.9 s/co ratio  IGP, Aptima HPV, rfx 16/18,45  Result Value Ref Range   DIAGNOSIS: Comment    Specimen adequacy: Comment    Clinician Provided ICD10 Comment    Performed by: Comment    PAP Smear Comment .    Note: Comment    Test Methodology Comment     HPV Aptima Negative Negative      Assessment & Plan:   Problem List Items Addressed This Visit      Cardiovascular and Mediastinum   HTN (hypertension) - Primary    Not under good control. Will increase to 20mg  lisinopril and add 25mg  of HCTZ and recheck 1 month. Call with any concerns.      Relevant Medications   lisinopril-hydrochlorothiazide (PRINZIDE,ZESTORETIC) 20-25 MG tablet   Other Relevant Orders   Basic metabolic panel       Follow up plan: Return in about 1 month (around 06/10/2018) for follow up BP.

## 2018-05-14 LAB — BASIC METABOLIC PANEL
BUN / CREAT RATIO: 23 (ref 9–23)
BUN: 14 mg/dL (ref 6–24)
CALCIUM: 9.3 mg/dL (ref 8.7–10.2)
CO2: 22 mmol/L (ref 20–29)
Chloride: 106 mmol/L (ref 96–106)
Creatinine, Ser: 0.61 mg/dL (ref 0.57–1.00)
GFR, EST AFRICAN AMERICAN: 120 mL/min/{1.73_m2} (ref >59)
GFR, EST NON AFRICAN AMERICAN: 104 mL/min/{1.73_m2} (ref >59)
Glucose: 93 mg/dL (ref 65–99)
Potassium: 4.7 mmol/L (ref 3.5–5.2)
Sodium: 142 mmol/L (ref 134–144)

## 2018-05-22 ENCOUNTER — Encounter: Payer: Self-pay | Admitting: Dietician

## 2018-06-11 ENCOUNTER — Encounter: Payer: Self-pay | Admitting: Family Medicine

## 2018-06-17 ENCOUNTER — Encounter: Payer: Self-pay | Admitting: Family Medicine

## 2018-06-17 ENCOUNTER — Ambulatory Visit (INDEPENDENT_AMBULATORY_CARE_PROVIDER_SITE_OTHER): Payer: Medicaid Other | Admitting: Family Medicine

## 2018-06-17 VITALS — BP 137/89 | HR 71 | Temp 98.5°F | Ht 67.0 in | Wt 355.2 lb

## 2018-06-17 DIAGNOSIS — I10 Essential (primary) hypertension: Secondary | ICD-10-CM | POA: Diagnosis not present

## 2018-06-17 NOTE — Progress Notes (Signed)
BP 137/89 (BP Location: Right Arm, Cuff Size: Large)   Pulse 71   Temp 98.5 F (36.9 C) (Oral)   Ht 5\' 7"  (1.702 m)   Wt (!) 355 lb 3.2 oz (161.1 kg)   LMP 05/29/2015   SpO2 98%   BMI 55.63 kg/m    Subjective:    Patient ID: Mackenzie Lopez, female    DOB: [birth year], 52 y.o.   MRN: [id number]  HPI: Mackenzie Lopez is a 52 y.o. female  Chief Complaint  Patient presents with  . Hypertension   HYPERTENSION Hypertension status: better  Satisfied with current treatment? yes Duration of hypertension: chronic BP monitoring frequency:  not checking BP medication side effects:  no Medication compliance: excellent compliance Previous BP meds: lisinopril-hctz Aspirin: no Recurrent headaches: no Visual changes: no Palpitations: no Dyspnea: no Chest pain: no Lower extremity edema: no Dizzy/lightheaded: no  Relevant past medical, surgical, family and social history reviewed and updated as indicated. Interim medical history since our last visit reviewed. Allergies and medications reviewed and updated.  Review of Systems  Constitutional: Negative.   Respiratory: Negative.   Cardiovascular: Negative.   Psychiatric/Behavioral: Negative.     Per HPI unless specifically indicated above     Objective:    BP 137/89 (BP Location: Right Arm, Cuff Size: Large)   Pulse 71   Temp 98.5 F (36.9 C) (Oral)   Ht 5\' 7"  (1.702 m)   Wt (!) 355 lb 3.2 oz (161.1 kg)   LMP 05/29/2015   SpO2 98%   BMI 55.63 kg/m   Wt Readings from Last 3 Encounters:  06/17/18 (!) 355 lb 3.2 oz (161.1 kg)  05/13/18 (!) 357 lb 6.4 oz (162.1 kg)  04/11/18 (!) 351 lb 2 oz (159.3 kg)    Physical Exam  Constitutional: She is oriented to person, place, and time. She appears well-developed and well-nourished. No distress.  HENT:  Head: Normocephalic and atraumatic.  Right Ear: Hearing normal.  Left Ear: Hearing normal.  Nose: Nose normal.  Eyes: Conjunctivae and lids are normal. Right eye exhibits  no discharge. Left eye exhibits no discharge. No scleral icterus.  Cardiovascular: Normal rate, regular rhythm, normal heart sounds and intact distal pulses. Exam reveals no gallop and no friction rub.  No murmur heard. Pulmonary/Chest: Effort normal and breath sounds normal. No stridor. No respiratory distress. She has no wheezes. She has no rales. She exhibits no tenderness.  Musculoskeletal: Normal range of motion.  Neurological: She is alert and oriented to person, place, and time.  Skin: Skin is warm, dry and intact. Capillary refill takes less than 2 seconds. No rash noted. She is not diaphoretic. No erythema. No pallor.  Psychiatric: She has a normal mood and affect. Her speech is normal and behavior is normal. Judgment and thought content normal. Cognition and memory are normal.  Nursing note and vitals reviewed.   Results for orders placed or performed in visit on 68/34/19  Basic metabolic panel  Result Value Ref Range   Glucose 93 65 - 99 mg/dL   BUN 14 6 - 24 mg/dL   Creatinine, Ser 0.61 0.57 - 1.00 mg/dL   GFR calc non Af Amer 104 >59 mL/min/1.73   GFR calc Af Amer 120 >59 mL/min/1.73   BUN/Creatinine Ratio 23 9 - 23   Sodium 142 134 - 144 mmol/L   Potassium 4.7 3.5 - 5.2 mmol/L   Chloride 106 96 - 106 mmol/L   CO2 22 20 - 29 mmol/L  Calcium 9.3 8.7 - 10.2 mg/dL      Assessment & Plan:   Problem List Items Addressed This Visit      Cardiovascular and Mediastinum   HTN (hypertension) - Primary    Under good control on recheck. Continue to monitor. Call with any concerns. Refills given. Recheck 6 months.       Relevant Orders   Basic metabolic panel       Follow up plan: Return in about 6 months (around 12/18/2018) for 6 month follow up.

## 2018-06-17 NOTE — Assessment & Plan Note (Signed)
Under good control on recheck. Continue to monitor. Call with any concerns. Refills given. Recheck 6 months.

## 2018-06-18 LAB — BASIC METABOLIC PANEL
BUN/Creatinine Ratio: 27 — ABNORMAL HIGH (ref 9–23)
BUN: 14 mg/dL (ref 6–24)
CO2: 20 mmol/L (ref 20–29)
Calcium: 9.1 mg/dL (ref 8.7–10.2)
Chloride: 109 mmol/L — ABNORMAL HIGH (ref 96–106)
Creatinine, Ser: 0.51 mg/dL — ABNORMAL LOW (ref 0.57–1.00)
GFR calc Af Amer: 127 mL/min/{1.73_m2} (ref >59)
GFR calc non Af Amer: 110 mL/min/{1.73_m2} (ref >59)
GLUCOSE: 80 mg/dL (ref 65–99)
Potassium: 4.5 mmol/L (ref 3.5–5.2)
Sodium: 144 mmol/L (ref 134–144)

## 2018-12-09 ENCOUNTER — Encounter: Payer: Self-pay | Admitting: Family Medicine

## 2018-12-25 ENCOUNTER — Ambulatory Visit: Payer: Medicaid Other | Admitting: Family Medicine

## 2019-07-06 ENCOUNTER — Ambulatory Visit: Payer: MEDICAID | Attending: Rheumatology | Admitting: Rheumatology

## 2019-07-06 ENCOUNTER — Other Ambulatory Visit: Payer: Self-pay

## 2019-07-06 ENCOUNTER — Encounter (INDEPENDENT_AMBULATORY_CARE_PROVIDER_SITE_OTHER): Payer: Self-pay | Admitting: Rheumatology

## 2019-07-06 VITALS — BP 132/80 | HR 83 | Temp 98.2°F | Ht 67.0 in | Wt >= 6400 oz

## 2019-07-06 DIAGNOSIS — G8929 Other chronic pain: Secondary | ICD-10-CM | POA: Insufficient documentation

## 2019-07-06 DIAGNOSIS — R7982 Elevated C-reactive protein (CRP): Secondary | ICD-10-CM

## 2019-07-06 DIAGNOSIS — R768 Other specified abnormal immunological findings in serum: Secondary | ICD-10-CM

## 2019-07-06 DIAGNOSIS — R5383 Other fatigue: Secondary | ICD-10-CM

## 2019-07-06 DIAGNOSIS — Z719 Counseling, unspecified: Secondary | ICD-10-CM

## 2019-07-06 DIAGNOSIS — M25562 Pain in left knee: Secondary | ICD-10-CM | POA: Insufficient documentation

## 2019-07-06 DIAGNOSIS — M2550 Pain in unspecified joint: Secondary | ICD-10-CM | POA: Insufficient documentation

## 2019-07-06 LAB — SEDIMENTATION RATE: ERYTHROCYTE SEDIMENTATION RATE (ESR): 34 mm/h — ABNORMAL HIGH (ref 0–30)

## 2019-07-06 LAB — C-REACTIVE PROTEIN(CRP),INFLAMMATION: CRP INFLAMMATION: 14 mg/L — ABNORMAL HIGH (ref <=5.0)

## 2019-07-06 LAB — CREATINE KINASE (CK), TOTAL, SERUM OR PLASMA: CREATINE KINASE: 59 U/L (ref <=170)

## 2019-07-06 NOTE — Nursing Note (Signed)
Venipuncture performed in office.  Norm Parcel, MA 07/06/2019, 15:01

## 2019-07-06 NOTE — Progress Notes (Signed)
[name]  [place] [address]  Phone: [phone]  Fax: [phone]    Date: 07/06/2019  Name: Dawn Fernandez  Age: 53 y.o.  MRN: [id number]    Chief Complaint:     Dawn Fernandez is a 53 y.o. y.o. female referred by Ocie Bob, FNP for   Chief Complaint   Patient presents with   . Knee Pain     left   . Fatigue   . Stiffness   . Swelling     left foot   . New Patient   .     HPI  I had the pleasure to meet Dawn Fernandez in clinic today for which the evaluation of polyarthralgia. She has pain in her left knee pain since teenage, with falling injury to that knee. The right knee not affected. Its a sharp pain, intermittent, worse with standing and walking. There is no much relieving factor. There is no gross swelling. There is joint stiffness lasting for 10-73min. The rest of the peripheral joints and axial spine are not affected. There is no significant family history of autoimmune disease.  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  There is no toxic habits.  She denies history of thrombosis or miscarriages.       Pain: 6 /10  Fatigue: 7 /10      Review of Systems (negative except as noted; positive findings in bold)  Constitutional: weight changes, fatigue, malaise, fever, chills, sweats  Skin: rashes, photosensitivity, hives, easy bruisability, alopecia, skin nodules or psoriasis  Eyes:  pain, redness, itching, visual blurring, dryness, foreign body sensation  ENT: tinnitus, hearing loss, sinus congestion, loss of smell, dry nose, bloody nose, oral ulcers, loss of taste, dry mouth, hoarseness  Cardiac: chest pain, palpitations, exertional dyspnea  Vascular: Raynaud's, chilblains, frostbite, venous stasis, thrombosis  Pulmonary: shortness of breath, cough, wheeze, hemoptysis, chest wall pain, pleuritic chest pain, current tobacco use  GI: difficulty swallowing, nausea, vomiting, GERD, ulcers, constipation, diarrhea, change in bowel habits, abdominal  pain, liver disease  GU: dysuria, blood in urine, nocturia, infections, kidney stones  Musculoskeletal: morning stiffness, neck pain, back pain, joint pain, joint swelling, muscle aching or tenderness, muscle weakness  Neurological: numbness, tingling, headaches, fainting, dizziness, imbalance, memory loss, seizure, stroke  Heme/Lymph: swollen or tender glands, anemia  Psychiatric:  anxiety, irritability, depression, sleep disturbance    Past Medical History  Past Medical History:   Diagnosis Date   . Arthritis    . Depression    . Heart murmur    . Hypertension    . Vitamin D deficiency          Current Outpatient Medications   Medication Sig   . cholecalciferol, vitamin D3, 350 mcg (14,000 unit) Oral Capsule Take by mouth   . ergocalciferol, vitamin D2, (DRISDOL) 1,250 mcg (50,000 unit) Oral Capsule TAKE ONE CAPSULE BY MOUTH twice A WEEK   . lisinopriL (PRINIVIL) 20 mg Oral Tablet TAKE ONE TABLET BY MOUTH ONCE DAILY   . meloxicam (MOBIC) 7.5 mg Oral Tablet TAKE ONE TABLET BY MOUTH ONCE DAILY   . venlafaxine (EFFEXOR XR) 37.5 mg Oral Capsule, Sust. Release 24 hr TAKE ONE CAPSULE BY MOUTH ONCE DAILY     No Known Allergies    Family History  Family Medical History:     Problem Relation (Age of Onset)    Hypertension (High Blood Pressure) Father  Social History  Social History     Socioeconomic History   . Marital status: Legally Separated     Spouse name: Not on file   . Number of children: Not on file   . Years of education: Not on file   . Highest education level: Not on file   Tobacco Use   . Smoking status: Never Smoker   . Smokeless tobacco: Never Used   Substance and Sexual Activity   . Alcohol use: Never     Frequency: Never   . Drug use: Never           Problem List  Patient Active Problem List   Diagnosis   . Polyarthralgia   . Rheumatoid factor positive   . Chronic pain of left knee       Physical Examination  BP 132/80   Pulse 83   Temp 36.8 C (98.2 F) (Thermal Scan)   Ht 1.702 m (5\' 7" )    Wt (!) 184 kg (405 lb)   SpO2 97%   BMI 63.43 kg/m       Wt Readings from Last 3 Encounters:   07/06/19 (!) 184 kg (405 lb)      BP Readings from Last 3 Encounters:   07/06/19 132/80       General: No acute distress. Normal affect.  Obese  Skin: No rashes.  HEENT: PERRLA. No oral lesions.  Neck: FROM. No cervical lymphadenopathy.  Chest: Clear to auscultation.  CV: S1 S2, RRR. No murmurs.  Abdomen: Normal bowel sounds. Sof be and nontender. No hepatosplenomegaly.  Extremities: No clubbing, cyanosis, or edema.  MSK: FROM of shoulders. No synovitis of bilateral elbows, wrists, MCPs, PIPs or DIPs. Hand grasp 5/5. Hips with FROM.  TTP along joint line of left knee without effusion.  No synovitis of knees, ankles or MTP joints.   Neurologic: Grossly intact.     No flowsheet data found.          Labs and Imaging  I have review and discussed these results with patients.     No results found for: WBC, RBC, HGB, HCT, NEUTROABS, MCV, MCH, MCHC, RDW, PLTCNT, MPV   No results found for: BUN, CREATININE, GFR, NA, K, CO2, CALCIUM, ALKPHOS, AST, ALT                    Assessment and Plan:     Dawn Fernandez was seen today for knee pain, fatigue, stiffness, swelling and new patient.    Diagnoses and all orders for this visit:    Polyarthralgia  Dawn Fernandez presented with polyarthralgia for the last 40 years, involving the large joints which are mechanical in nature. Her previous work up showed positive rheumatoid factor and borderline elevated CRP.  There is a borderline elevated ANA of 1-80 homogeneous pattern.  The rest of serology including sed rate, TSH, vitamin-D level, were unremarkable..  We do not have results of her recent imaging of left knee.  Given the above findings, this would warrant for further work up.  We will need to rule out inflammatory arthropathy such as rheumatoid arthritis.  I discussed my finding with the patient, address relevant concerns, and requested labs and imaging listed below for further evaluation.     -      RHEUMATOID FACTOR, TITER; Future  -     CYCLIC CITRULLINATED PEPTIDE ANTIBODIES, IGG, SERUM; Future  -     C-REACTIVE PROTEIN(CRP),INFLAMMATION; Future  -     SEDIMENTATION RATE; Future  -  CREATINE KINASE (CK), TOTAL, SERUM; Future  -     SS-A AND SS-B ANTIBODIES, IGG, SERUM; Future  -     HEPATITIS B SURFACE ANTIGEN; Future  -     HEPATITIS B SURFACE ANTIBODY; Future  -     HEPATITIS C ANTIBODY SCREEN WITH REFLEX TO HCV PCR; Future    Rheumatoid factor positive  Rheumatoid factor is not specific to rheumatoid arthritis, as it can be present in conditions such as chronic infection, hepatitis B and C and pulmonary disorders. I discussed my findings with the patient, addressed her concerns and requested labs and imaging listed above for further evaluation.       Positive ANA (antinuclear antibody)  There is about 5 percent of the population with a positive ANA without clinical significance, and the incidence increases with age.  On the other hand, we have observed production of antibodies years before their presentation, specifically in rheumatoid arthritis and lupus.  However she has positive ANA of low titer 1-80 without other features of systemic lupus or connective tissue disease put her on category of low likelihood.  I discussed my findings with the patient, address her concerns, and requested labs listed above for further evaluation.    Chronic pain of left knee  Dawn Fernandez has chronic left knee pain which may benefit from local steroid injection.  However she has significant elevated BMI, which would hamper the procedure of therapeutic arthrocentesis.  I discussed my findings with the patient, address her concerns, and have schedule her for ultrasound-guided left knee injection in her next clinic visit in order to help relieve her joint symptoms.  We will also contact PCP to obtain results of previous imaging of left knee done in July of 2020.   -     Schedule [facility] Ultrasound Procedure Visit; Future    CRP  elevated  Please see above    Health counseling  Patient Education/ Counseling:    Immunizations:    There is no immunization history on file for this patient.     Cancer Screening: Pateint is advise to keep up to date on Pap Smear, Mammogram, PSA, Colonoscopy    Bone Health: Encourage calcium and vitamin D supplementation.    Cardiovascular:   Lipid panel: No components found for: LDL, No results found for: TRIG  Blood pressure:   BP Readings from Last 1 Encounters:   07/06/19 132/80       Smoking Status:  None      She will return for follow up in 2-3 weeks.    Recommendations  1. Take your medications as prescribed.  2. Avoid smoking and avoid any alcoholic beverages  3. Please contact your PCP's office if you develop a fever with flu like symptoms.  4. Please call the rheumatology office in advance if you need any refills.      Adelene AmasKing Velecia Ovitt, MD      Spent 45-minute face-to-face time with the patient of which more than 50% of the time was spent on counseling on test results, clinical implications, etiology, diagnosis, prognosis, treatment options, risks and benefits.      This note was partially generated using MModal Fluency Direct system, and there may be some incorrect words, spellings, and punctuation that were not noted in checking the note before saving.

## 2019-07-07 LAB — RHEUMATOID FACTOR, TITER
RHEUMATOID FACTOR, SCREEN: POSITIVE — AB
RHEUMATOID FACTOR, TITER: 1:8 {titer} — ABNORMAL HIGH

## 2019-07-07 LAB — HEPATITIS C ANTIBODY SCREEN WITH REFLEX TO HCV PCR: HCV ANTIBODY QUALITATIVE: NEGATIVE

## 2019-07-07 LAB — HEPATITIS B SURFACE ANTIBODY: HBV SURFACE ANTIBODY QUANTITATIVE: 0 m[IU]/mL (ref <8)

## 2019-07-07 LAB — HEPATITIS B SURFACE ANTIGEN: HBV SURFACE ANTIGEN QUALITATIVE: NEGATIVE

## 2019-07-08 LAB — CYCLIC CITRULLINATED PEPTIDE ANTIBODIES, IGG, SERUM
CYCLIC CITRULLINATED PEPTIDE ANTIBODY IGG QUAL: NEGATIVE
CYCLIC CITRULLINATED PEPTIDE ANTIBODY IGG QUANT: <0.5 U/mL (ref <3.0)

## 2019-07-08 LAB — SS-A/RO ANTIBODIES, IGG, SERUM: SS-A/RO, ANTIBODIES, IGG QUALITATIVE: POSITIVE — AB

## 2019-07-08 LAB — SS-B/LA ANTIBODIES, IGG, SERUM: SS-B/LA ANTIBODIES, IGG, QUALITATIVE: NEGATIVE

## 2019-07-28 ENCOUNTER — Encounter (INDEPENDENT_AMBULATORY_CARE_PROVIDER_SITE_OTHER): Payer: Self-pay | Admitting: Rheumatology

## 2019-07-28 ENCOUNTER — Ambulatory Visit (INDEPENDENT_AMBULATORY_CARE_PROVIDER_SITE_OTHER): Payer: MEDICAID

## 2019-07-28 ENCOUNTER — Other Ambulatory Visit: Payer: Self-pay

## 2019-07-28 ENCOUNTER — Other Ambulatory Visit (INDEPENDENT_AMBULATORY_CARE_PROVIDER_SITE_OTHER): Payer: Self-pay | Admitting: Rheumatology

## 2019-07-28 ENCOUNTER — Ambulatory Visit: Payer: MEDICAID | Attending: Rheumatology | Admitting: Rheumatology

## 2019-07-28 VITALS — BP 130/80 | HR 81 | Temp 98.0°F | Ht 67.0 in | Wt >= 6400 oz

## 2019-07-28 DIAGNOSIS — M25562 Pain in left knee: Secondary | ICD-10-CM

## 2019-07-28 DIAGNOSIS — R768 Other specified abnormal immunological findings in serum: Secondary | ICD-10-CM

## 2019-07-28 DIAGNOSIS — Z712 Person consulting for explanation of examination or test findings: Secondary | ICD-10-CM

## 2019-07-28 DIAGNOSIS — R7982 Elevated C-reactive protein (CRP): Secondary | ICD-10-CM

## 2019-07-28 DIAGNOSIS — M159 Polyosteoarthritis, unspecified: Secondary | ICD-10-CM

## 2019-07-28 DIAGNOSIS — G8929 Other chronic pain: Secondary | ICD-10-CM

## 2019-07-28 NOTE — Procedures (Signed)
Ultrasound guidance therapeutic arthrocentesis     Date: 07/28/19 16:43  Site:  Left knee joint.  Study indication:  Left knee therapeutic injection with ultrasound guidance for Diagnosis:  Left knee osteoarthritis    Equipment:  With 18 MHz transducer - GE Logiq E  Findings: The patient was placed on seated position.      Findings: The patient was placed in supine position with the knee flexed to 30. Longitudinal and transverse scans of the quadriceps and patellar tendons, patellar retinacula, and suprapatellar recess were obtained. The distal femoral trochlear cartilage was assessed with the probe placed in the suprapatellar space in the transverse plane and with the knee in maximal flexion. Cartilage irregularities were identified. Longitudinal views of the cartilage over the medial and lateral femoral condyles were checked. The above structures were also scanned perpendicular to the original scan plane. After the left knee joint was identified in the longitudinal view, A 3.5 inch (90 mm) 20 gauge spinal needle was inserted and  Its tip was visualized in the knee joint recess. The knee joint was then injected with 40 mg of Kenalog mixed with 3 ml of 1% lidocaine. Localization of the injected material within the knee joint capsule was documented.      Impression: Successful left knee joint therapeutic injection with ultrasound-guided needle localization.

## 2019-07-28 NOTE — Progress Notes (Signed)
[name]  [place] [address]  Phone: [phone]  Fax: [phone]    Date: 07/28/2019  Name: Dawn Fernandez  Age: 53 y.o.  MRN: [id number]    Chief Complaint:     Dawn Fernandez is a 53 y.o. y.o. female referred by Ocie Bob, FNP for   Chief Complaint   Patient presents with   . Ultrasound     LEFT KNEE   . Fatigue   . Knee Pain     LEFT   . Stiffness     20 MINUTES   . Swelling     LEFT FOOT   .   Polyarthralgia  Positive ANA and rheumatoid factor  Chronic knee pain    HPI  Since he has return to clinic for follow-up visit, as she was last seen in August 2020 for evaluation of polyarthralgia with positive ANA and rheumatoid factor. Her left knee has been bothering, without new joint pain, swelling. She claims morning stiffness of 41mn. Otherwise she is in her usual state of health. There is no change in her level of daily activity. There is no new rash, oral ulcers, new hair loss, sicca, photosensitivity, raynauds, chest pain, change in breathing, appetite, weight, bowel habits, and denied fever, recent infection or constitutional symptoms.       Review of Systems (negative except as noted; positive findings in bold)  Constitutional: weight changes, fatigue, malaise, fever, chills, sweats  Skin: rashes, photosensitivity, hives, easy bruisability, alopecia, skin nodules or psoriasis  Eyes:  pain, redness, itching, visual blurring, dryness, foreign body sensation  ENT: tinnitus, hearing loss, sinus congestion, loss of smell, dry nose, bloody nose, oral ulcers, loss of taste, dry mouth, hoarseness  Cardiac: chest pain, palpitations, exertional dyspnea  Vascular: Raynaud's, chilblains, frostbite, venous stasis, thrombosis  Pulmonary: shortness of breath, cough, wheeze, hemoptysis, chest wall pain, pleuritic chest pain, current tobacco use  GI: difficulty swallowing, nausea, vomiting, GERD, ulcers, constipation, diarrhea, change in bowel habits, abdominal  pain, liver disease  GU: dysuria, blood in urine, nocturia, infections, kidney stones      Past Medical History  Past Medical History:   Diagnosis Date   . Arthritis    . Depression    . Heart murmur    . Hypertension    . Vitamin D deficiency          Current Outpatient Medications   Medication Sig   . ergocalciferol, vitamin D2, (DRISDOL) 1,250 mcg (50,000 unit) Oral Capsule TAKE ONE CAPSULE BY MOUTH twice A WEEK   . lisinopriL (PRINIVIL) 20 mg Oral Tablet 40 mg Once a day    . meloxicam (MOBIC) 7.5 mg Oral Tablet 15 mg Once a day    . multivitamin with minerals (ONE-A-DAY 50 PLUS ORAL) Take by mouth   . venlafaxine (EFFEXOR XR) 37.5 mg Oral Capsule, Sust. Release 24 hr 75 mg Once a day      No Known Allergies    Family History  Family Medical History:     Problem Relation (Age of Onset)    Hypertension (High Blood Pressure) Father            Social History  Social History     Socioeconomic History   . Marital status: Legally Separated     Spouse name: Not on file   . Number of children: Not on file   . Years of education: Not on file   . Highest education level: Not on file  Tobacco Use   . Smoking status: Never Smoker   . Smokeless tobacco: Never Used   Substance and Sexual Activity   . Alcohol use: Never     Frequency: Never   . Drug use: Never           Problem List  Patient Active Problem List   Diagnosis   . Polyarthralgia   . Rheumatoid factor positive   . Chronic pain of left knee       Physical Examination  BP 130/80   Pulse 81   Temp 36.7 C (98 F) (Thermal Scan)   Ht 1.702 m (5' 7")   Wt (!) 186 kg (409 lb)   SpO2 97%   BMI 64.06 kg/m       Wt Readings from Last 3 Encounters:   07/28/19 (!) 186 kg (409 lb)   07/06/19 (!) 184 kg (405 lb)      BP Readings from Last 3 Encounters:   07/28/19 130/80   07/06/19 132/80       General: No acute distress. Normal affect.  Obese  Skin: No rashes.  HEENT: PERRLA. No oral lesions.  Neck: FROM. No cervical lymphadenopathy.  Chest: Clear to auscultation.  CV:  S1 S2, RRR. No murmurs.  Abdomen: Normal bowel sounds. Sof be and nontender. No hepatosplenomegaly.  Extremities: No clubbing, cyanosis, or edema.  MSK: FROM of shoulders. No synovitis of bilateral elbows, wrists, MCPs, PIPs or DIPs. Hand grasp 5/5. Hips with FROM.  TTP along joint line of left knee without effusion.  No synovitis of knees, ankles or MTP joints.   Neurologic: Grossly intact.     Multi-Dimensional Health Assessment Scores 07/06/2019   FN 2.3   Pain 6   PtGL 7   Rapid 3 15.3   ROS  0   Fatigue 7             Labs and Imaging  I have review and discussed these results with patients.     No results found for: WBC, RBC, HGB, HCT, NEUTROABS, MCV, MCH, MCHC, RDW, PLTCNT, MPV   No results found for: BUN, CREATININE, GFR, NA, K, CO2, CALCIUM, ALKPHOS, AST, ALT    Component      Latest Ref Rng & Units 07/06/2019           3:02 PM   SEDIMENTATION RATE      0 - 30 mm/hr 34 (H)     Component      Latest Ref Rng & Units 07/06/2019           3:02 PM   C-REACTIVE PROTEIN HIGH SENSITIVITY (INFLAMMATION)      <=5.0 mg/L 14.0 (H)     Component      Latest Ref Rng & Units 07/06/2019           3:02 PM   RHEUMATOID FACTOR, SCREEN      Negative Positive (A)   RHEUMATOID FACTOR, TITER      <1:1 1:8 (H)     Component      Latest Ref Rng & Units 07/06/2019           5:68 PM   CYCLIC CITRULLINATED PEPTIDE ANTIBODY IGG       <1.2 U/mL <7.5   CYCLIC CITRULLINATED PEPTIDE ANTIBODY IGG QUAL      Negative Negative     Component      Latest Ref Rng & Units 07/06/2019 07/06/2019  3:02 PM  3:02 PM   SS-A/RO ANTIBODIES, IGG, QUALITATIVE      Negative Positive (A)    SS-B/LA ANTIBODIES, IGG, QUALITATIVE      Negative  Negative     Component      Latest Ref Rng & Units 07/06/2019           3:02 PM   CREATINE KINASE (CK)      <=170 U/L 59     Component      Latest Ref Rng & Units 07/06/2019 07/06/2019 07/06/2019           3:02 PM  3:02 PM  3:02 PM   HEPATITIS B SURFACE AG      Negative Negative     HBV SURFACE ANTIBODY QUANTITATIVE      <8  mIU/mL   0    HEPATITIS C ANTIBODY      Negative   Negative         Assessment and Plan:     Shamica was seen today for ultrasound, fatigue, knee pain, stiffness and swelling.    Diagnoses and all orders for this visit:    Osteoarthritis of multiple joints  Brittan suffers from osteoarthritis of multiple joints which is evident on clinical examination.  Her worst joint is on her left knee today.  Her recent workup shows elevated inflammatory markers both ESR and CRP, RF and SSA.  The rest of serology including CCP, CK and hepatitis screen were unremarkable.  So far the small joints of the periphery were not affected.  Nonetheless, we will need to continue monitor progress.  I discussed my findings with the patient, address her concerns, and we have agree to perform ultrasound-guided left knee injection in this visit.  She may take regular Tylenol or NSAID for pain as needed.    Rheumatoid factor positive  Carleta has positive rheumatoid factor with SSA and borderline ANA 1-80 homogeneous pattern raised concerns of Sjogren syndrome, however she does not have sicca symptoms or polyarthralgia involving the small joints of the hands and feet.  We will need to continue to observe her progress to determine the significance of these positive antibody findings.  I discussed my findings with the patient, address her concerns, and requested labs listed below to be done prior to her next clinic visit.  -     CBC/DIFF; Future  -     COMPREHENSIVE METABOLIC PANEL, NON-FASTING; Future  -     C-REACTIVE PROTEIN(CRP),INFLAMMATION; Future  -     SEDIMENTATION RATE; Future    CRP elevated  Please see above    Chronic pain of left knee  We are perform ultrasound-guided therapeutic arthrocentesis to the left knee in this visit today.  Please refer to procedure note.  -     [place] RHEUM [place] JT INF MAJOR; Future  -     [place] Joint Injection Major (in clinic)    Encounter to discuss test results  I had discussed and disclosed  all the test  results as listed above, and addressed all the concerns.          She will return for follow up in 12 weeks.    Recommendations  1. Take your medications as prescribed.  2. Avoid smoking and avoid any alcoholic beverages  3. Please contact your PCP's office if you develop a fever with flu like symptoms.  4. Please call the rheumatology office in advance if you need any refills.      Edison Pace  Lavada Mesi, MD      Spent 25-minute face-to-face time with the patient of which more than 50% of the time was spent on counseling on test results, clinical implications, etiology, diagnosis, prognosis, treatment options, risks and benefits.      This note was partially generated using MModal Fluency Direct system, and there may be some incorrect words, spellings, and punctuation that were not noted in checking the note before saving.

## 2019-07-29 ENCOUNTER — Encounter (INDEPENDENT_AMBULATORY_CARE_PROVIDER_SITE_OTHER): Payer: Self-pay | Admitting: Rheumatology

## 2019-11-02 ENCOUNTER — Encounter (INDEPENDENT_AMBULATORY_CARE_PROVIDER_SITE_OTHER): Payer: Self-pay | Admitting: Rheumatology

## 2019-12-22 ENCOUNTER — Encounter (INDEPENDENT_AMBULATORY_CARE_PROVIDER_SITE_OTHER): Payer: Self-pay | Admitting: Rheumatology

## 2019-12-22 ENCOUNTER — Ambulatory Visit: Payer: MEDICAID | Attending: Rheumatology | Admitting: Rheumatology

## 2019-12-22 ENCOUNTER — Other Ambulatory Visit: Payer: Self-pay

## 2019-12-22 VITALS — BP 128/86 | HR 80 | Temp 97.6°F | Ht 67.0 in | Wt >= 6400 oz

## 2019-12-22 DIAGNOSIS — M159 Polyosteoarthritis, unspecified: Secondary | ICD-10-CM

## 2019-12-22 DIAGNOSIS — Z712 Person consulting for explanation of examination or test findings: Secondary | ICD-10-CM

## 2019-12-22 DIAGNOSIS — R768 Other specified abnormal immunological findings in serum: Secondary | ICD-10-CM | POA: Insufficient documentation

## 2019-12-22 LAB — COMPREHENSIVE METABOLIC PANEL, NON-FASTING
ALBUMIN: 4.2 g/dL (ref 3.2–4.6)
ALKALINE PHOSPHATASE: 79 U/L (ref 20–130)
ALT (SGPT): 12 U/L (ref <=52)
ANION GAP: 6 mmol/L
AST (SGOT): 15 U/L (ref <=35)
BILIRUBIN TOTAL: 0.4 mg/dL (ref 0.3–1.2)
BUN/CREA RATIO: 46
BUN: 22 mg/dL (ref 10–25)
CALCIUM: 9.6 mg/dL (ref 8.8–10.3)
CHLORIDE: 107 mmol/L (ref 98–111)
CO2 TOTAL: 28 mmol/L (ref 21–35)
CREATININE: 0.48 mg/dL (ref <=1.30)
ESTIMATED GFR: >60 mL/min/{1.73_m2}
GLUCOSE: 88 mg/dL (ref 70–110)
POTASSIUM: 4.4 mmol/L (ref 3.5–5.0)
PROTEIN TOTAL: 6.7 g/dL (ref 6.0–8.3)
SODIUM: 141 mmol/L (ref 135–145)

## 2019-12-22 LAB — CBC WITH DIFF
BASOPHIL #: 0 10*3/uL (ref 0.00–0.20)
BASOPHIL %: 0 %
EOSINOPHIL #: 0.2 10*3/uL (ref 0.00–0.50)
EOSINOPHIL %: 3 %
HCT: 40.1 % (ref 34.6–46.2)
HGB: 13 g/dL (ref 11.8–15.8)
LYMPHOCYTE #: 1.2 10*3/uL (ref 0.90–3.40)
LYMPHOCYTE %: 19 %
MCH: 26 pg — ABNORMAL LOW (ref 27.6–33.2)
MCHC: 32.3 g/dL — ABNORMAL LOW (ref 32.6–35.4)
MCV: 80.4 fL — ABNORMAL LOW (ref 82.3–96.7)
MONOCYTE #: 0.4 10*3/uL (ref 0.20–0.90)
MONOCYTE %: 6 %
MPV: 9.3 fL (ref 6.6–10.2)
NEUTROPHIL #: 4.6 10*3/uL (ref 1.50–6.40)
NEUTROPHIL %: 72 %
PLATELETS: 212 10*3/uL (ref 140–440)
RBC: 4.99 10*6/uL (ref 3.80–5.24)
RDW: 15.3 % — ABNORMAL HIGH (ref 12.4–15.2)
WBC: 6.4 10*3/uL (ref 3.5–10.3)

## 2019-12-22 LAB — SEDIMENTATION RATE: ERYTHROCYTE SEDIMENTATION RATE (ESR): 39 mm/h — ABNORMAL HIGH (ref 0–30)

## 2019-12-22 LAB — C-REACTIVE PROTEIN(CRP),INFLAMMATION: CRP INFLAMMATION: 9.1 mg/L — ABNORMAL HIGH (ref <=5.0)

## 2019-12-22 NOTE — Progress Notes (Signed)
[name]  [address]  [place] [address]  Phone: [phone]  Fax: [phone]    Date: 12/23/2019  Name: Dawn Fernandez  Age: 54 y.o.  MRN: [id number]    Chief Complaint:     Dawn Fernandez is a 54 y.o. y.o. female referred by Daisey Must, FNP for   Chief Complaint   Patient presents with   . Knee Pain     left   . Dry Mouth   . Stiffness   . Swelling     legs, feet   . Other     polyarthralgia   .   Osteoarthritis of multiple joints  Positive ANA 1-80 homogeneous, SSA and RF  Chronic knee pain    HPI  Since he has return to clinic for follow-up visit, as she was last seen in September 2020 for the management of osteoarthritis .  We injected the left knee in the last clinic visit with ultrasound guidance. However the injection only lasted for 2 weeks. She has dry mouth, but no dry eyes. The rest of small joints of the hands and feet are not affected, without joint swelling. She claims morning stiffness of 20 min.  it is affecting her mobility, currently on wheelchair.  Otherwise she is in her usual state of health. There is no change in her level of daily activity. There is no new rash, oral ulcers, new hair loss, sicca, photosensitivity, raynauds, chest pain, change in breathing, appetite, weight, bowel habits, and denied fever, recent infection or constitutional symptoms.       Review of Systems (negative except as noted; positive findings in bold)  Constitutional: weight changes, fatigue, malaise, fever, chills, sweats  Skin: rashes, photosensitivity, hives, easy bruisability, alopecia, skin nodules or psoriasis  Eyes:  pain, redness, itching, visual blurring, dryness, foreign body sensation  ENT: tinnitus, hearing loss, sinus congestion, loss of smell, dry nose, bloody nose, oral ulcers, loss of taste, dry mouth, hoarseness  Cardiac: chest pain, palpitations, exertional dyspnea  Vascular: Raynaud's, chilblains, frostbite, venous stasis, thrombosis  Pulmonary:  shortness of breath, cough, wheeze, hemoptysis, chest wall pain, pleuritic chest pain, current tobacco use  GI: difficulty swallowing, nausea, vomiting, GERD, ulcers, constipation, diarrhea, change in bowel habits, abdominal pain, liver disease  GU: dysuria, blood in urine, nocturia, infections, kidney stones      Past Medical History  Past Medical History:   Diagnosis Date   . Arthritis    . Depression    . Dry mouth    . Heart murmur    . Hypertension    . Vitamin D deficiency          Current Outpatient Medications   Medication Sig   . amLODIPine (NORVASC) 5 mg Oral Tablet TAKE ONE TABLET BY MOUTH ONCE DAILY   . cholecalciferol, vitamin D3, (VITAMIN D3 ORAL) Take by mouth   . ergocalciferol, vitamin D2, (DRISDOL) 1,250 mcg (50,000 unit) Oral Capsule TAKE ONE CAPSULE BY MOUTH twice A WEEK (Patient not taking: Reported on 12/22/2019)   . lisinopriL (PRINIVIL) 20 mg Oral Tablet 40 mg Once a day    . meloxicam (MOBIC) 7.5 mg Oral Tablet 15 mg Once a day    . multivitamin with minerals (ONE-A-DAY 50 PLUS ORAL) Take by mouth   . venlafaxine (EFFEXOR XR) 37.5 mg Oral Capsule, Sust. Release 24 hr 75 mg Once a day      No Known Allergies    Family History  Family Medical History:  Problem Relation (Age of Onset)    Hypertension (High Blood Pressure) Father            Social History  Social History     Socioeconomic History   . Marital status: Legally Separated     Spouse name: Not on file   . Number of children: Not on file   . Years of education: Not on file   . Highest education level: Not on file   Tobacco Use   . Smoking status: Never Smoker   . Smokeless tobacco: Never Used   Substance and Sexual Activity   . Alcohol use: Never   . Drug use: Never     Social Determinants of Health     Financial Resource Strain:    . Difficulty of Paying Living Expenses:    Food Insecurity:    . Worried About Charity fundraiser in the Last Year:    . Arboriculturist in the Last Year:    Transportation Needs:    . Lexicographer (Medical):    Marland Kitchen Lack of Transportation (Non-Medical):    Physical Activity:    . Days of Exercise per Week:    . Minutes of Exercise per Session:    Stress:    . Feeling of Stress :    Intimate Partner Violence:    . Fear of Current or Ex-Partner:    . Emotionally Abused:    Marland Kitchen Physically Abused:    . Sexually Abused:            Problem List  Patient Active Problem List   Diagnosis   . Polyarthralgia   . Rheumatoid factor positive   . Chronic pain of left knee       Physical Examination  BP 128/86   Pulse 80   Temp 36.4 C (97.6 F) (Thermal Scan)   Ht 1.702 m (5\' 7" )   Wt (!) 200 kg (441 lb)   SpO2 98%   BMI 69.07 kg/m       Wt Readings from Last 3 Encounters:   12/22/19 (!) 200 kg (441 lb)   07/28/19 (!) 186 kg (409 lb)   07/06/19 (!) 184 kg (405 lb)      BP Readings from Last 3 Encounters:   12/22/19 128/86   07/28/19 130/80   07/06/19 132/80       General: No acute distress. Normal affect.  Obese  Skin: No rashes.  HEENT: PERRLA. No oral lesions.  Neck: FROM. No cervical lymphadenopathy.  Chest: Clear to auscultation.  CV: S1 S2, RRR. No murmurs.  Abdomen: Normal bowel sounds. Sof be and nontender. No hepatosplenomegaly.  Extremities: No clubbing, cyanosis, or edema.  MSK: FROM of shoulders. No synovitis of bilateral elbows, wrists, MCPs, PIPs or DIPs. Hand grasp 5/5. Hips with FROM.  TTP along joint line of left knee without effusion.  No synovitis of knees, ankles or MTP joints.   Neurologic: Grossly intact.     Multi-Dimensional Health Assessment Scores 07/06/2019 12/22/2019   FN 2.3 0.3   Pain 6 8.5   PtGL 7 (No Data)   Rapid 3 15.3 -   ROS  0 1   Fatigue 7 (No Data)             Labs and Imaging  I have review and discussed these results with patients.     Lab Results   Component Value Date    WBC 6.4 12/22/2019    RBC  4.99 12/22/2019    HGB 13.0 12/22/2019    HCT 40.1 12/22/2019    MCV 80.4 (L) 12/22/2019    MCH 26.0 (L) 12/22/2019    MCHC 32.3 (L) 12/22/2019    RDW 15.3 (H) 12/22/2019     PLTCNT 212 12/22/2019    MPV 9.3 12/22/2019      Lab Results   Component Value Date    BUN 22 12/22/2019    CREATININE 0.48 12/22/2019    GFR >60 12/22/2019    CO2 28 12/22/2019    CALCIUM 9.6 12/22/2019    ALKPHOS 79 12/22/2019    AST 15 12/22/2019    ALT 12 12/22/2019     Component      Latest Ref Rng & Units 07/06/2019 07/06/2019 12/22/2019           3:02 PM  3:02 PM  2:13 PM   C-REACTIVE PROTEIN HIGH SENSITIVITY (INFLAMMATION)      <=5.0 mg/L 14.0 (H)  9.1 (H)   SEDIMENTATION RATE      0 - 30 mm/hr  34 (H)      Component      Latest Ref Rng & Units 12/22/2019           2:13 PM   SEDIMENTATION RATE      0 - 30 mm/hr 39 (H)         Assessment and Plan:     Charis was seen today for knee pain, dry mouth, stiffness, swelling and other.    Diagnoses and all orders for this visit:    Osteoarthritis of multiple joints  Frimy suffers from osteoarthritis of multiple joints which is evident on clinical examination.  Her worst joint is on her left knee today.  She not respond well to previous ultrasound-guided left knee injection, as it only lasted for 2 weeks. So far the small joints of the periphery were not affected.  I discussed my finding with the patient, address relevant concerns, and requested labs listed below to monitor disease activity and drug toxicity.  We agree to refer her to Orthopedics for further evaluation treatment  She may take regular Tylenol or NSAID for pain as needed.  -     Refer to [facility]; Future    Rheumatoid factor positive  Katrinna has positive rheumatoid factor with SSA and borderline ANA 1-80 homogeneous pattern raised concerns of Sjogren syndrome, and she has a dry mouth without dry eyes in this visit.  Her inflammatory markers remain elevated, but improved somewhat in this visit.  So far there known new joint symptoms or features of inflammatory arthropathy. I discussed my finding with the patient, address relevant concerns, and requested labs listed below to  monitor progress.   -     CBC/DIFF; Future  -     COMPREHENSIVE METABOLIC PANEL, NON-FASTING; Future  -     C-REACTIVE PROTEIN(CRP),INFLAMMATION; Future  -     SEDIMENTATION RATE; Future    Encounter to discuss test results  I had discussed and disclosed  all the test results as listed above, and addressed all the concerns.          She will return for follow up in 12 weeks.    Recommendations  1. Take your medications as prescribed.  2. Avoid smoking and avoid any alcoholic beverages  3. Please contact your PCP's office if you develop a fever with flu like symptoms.  4. Please call the rheumatology office in advance if you need any  refills.      Adelene Amas, MD      On the day of the encounter, a total of 32 minutes was spent on this patient encounter including review of historical information, examination, documentation and post- visit activities.      This note was partially generated using MModal Fluency Direct system, and there may be some incorrect words, spellings, and punctuation that were not noted in checking the note before saving.

## 2019-12-23 ENCOUNTER — Encounter (INDEPENDENT_AMBULATORY_CARE_PROVIDER_SITE_OTHER): Payer: Self-pay | Admitting: Rheumatology

## 2020-01-25 ENCOUNTER — Other Ambulatory Visit (HOSPITAL_BASED_OUTPATIENT_CLINIC_OR_DEPARTMENT_OTHER): Payer: Self-pay | Admitting: Sports Medicine

## 2020-01-25 DIAGNOSIS — M25562 Pain in left knee: Secondary | ICD-10-CM

## 2020-02-01 ENCOUNTER — Ambulatory Visit (HOSPITAL_BASED_OUTPATIENT_CLINIC_OR_DEPARTMENT_OTHER): Payer: Self-pay | Admitting: Sports Medicine

## 2020-02-03 ENCOUNTER — Ambulatory Visit (INDEPENDENT_AMBULATORY_CARE_PROVIDER_SITE_OTHER): Payer: MEDICAID | Admitting: Sports Medicine

## 2020-02-03 ENCOUNTER — Ambulatory Visit
Admission: RE | Admit: 2020-02-03 | Discharge: 2020-02-03 | Disposition: A | Discharge status: Home / Self Care | Payer: MEDICAID | Source: Ambulatory Visit | Attending: Sports Medicine | Admitting: Sports Medicine

## 2020-02-03 ENCOUNTER — Encounter (HOSPITAL_BASED_OUTPATIENT_CLINIC_OR_DEPARTMENT_OTHER): Payer: Self-pay | Admitting: Sports Medicine

## 2020-02-03 ENCOUNTER — Other Ambulatory Visit: Payer: Self-pay

## 2020-02-03 VITALS — Ht 67.0 in | Wt >= 6400 oz

## 2020-02-03 DIAGNOSIS — M25562 Pain in left knee: Secondary | ICD-10-CM | POA: Insufficient documentation

## 2020-02-03 DIAGNOSIS — M159 Polyosteoarthritis, unspecified: Secondary | ICD-10-CM

## 2020-02-03 DIAGNOSIS — M2390 Unspecified internal derangement of unspecified knee: Secondary | ICD-10-CM

## 2020-02-03 DIAGNOSIS — M1712 Unilateral primary osteoarthritis, left knee: Secondary | ICD-10-CM | POA: Insufficient documentation

## 2020-02-03 NOTE — Progress Notes (Signed)
[facility], MEDICAL OFFICE BUILDING  [address]  [place] [address]  Dept: [phone]  Dept Fax: [phone]        NEW PATIENT OFFICE VISIT      PATIENT NAME:  Dawn Fernandez  MEDICAL RECORD NUMBER: [id number]    DICTATING PHYSICIAN:  Brayton Layman, DO  REFERRING PHYSICIAN:  Adelene Amas, MD    DOB:  1966/03/16  DOS:  02/03/2020      CHIEF COMPLAINT:   Chief Complaint   Patient presents with   . New Patient     left knee pain        HISTORY OF PRESENT ILLNESS:    The patient is a 54 year old female who comes in today with the chief complaint of left knee pain she has had for a long time, she states.  The pain is constant.  It is sharp.  It is shooting pain and goes down her entire left leg.  It is worse with cold weather.  She rates the pain as 7/10.  She was seen by rheumatology for a positive ANA and arthritis.  She was given an injection in her left knee joint, which she states really did not help at all.  It helped for a few weeks but now the pain is worse and it is back, worse with walking and daily activities.  She has tried Advil which is not helping the pain.          PAST MEDICAL HISTORY:  Past Medical History:   Diagnosis Date   . Arthritis    . Depression    . Dry mouth    . Heart murmur    . Hypertension    . Vitamin D deficiency            PAST SURGICAL HISTORY:  Past Surgical History:   Procedure Laterality Date   . ANKLE SURGERY Left    . CESAREAN SECTION  1986   . HX TUBAL LIGATION             FAMILY HISTORY:  Family Medical History:     Problem Relation (Age of Onset)    Hypertension (High Blood Pressure) Father              SOCIAL HISTORY:  Social History     Socioeconomic History   . Marital status: Legally Separated     Spouse name: Not on file   . Number of children: Not on file   . Years of education: Not on file   . Highest education level: Not on file   Occupational History   . Not on file   Tobacco Use   . Smoking status: Never Smoker   . Smokeless tobacco: Never Used      Substance and Sexual Activity   . Alcohol use: Never   . Drug use: Never   . Sexual activity: Not on file   Other Topics Concern   . Not on file   Social History Narrative   . Not on file     Social Determinants of Health     Financial Resource Strain:    . Difficulty of Paying Living Expenses:    Food Insecurity:    . Worried About Programme researcher, broadcasting/film/video in the Last Year:    . Barista in the Last Year:    Transportation Needs:    . Freight forwarder (Medical):    Marland Kitchen Lack of Transportation (Non-Medical):    Physical  Activity:    . Days of Exercise per Week:    . Minutes of Exercise per Session:    Stress:    . Feeling of Stress :    Intimate Partner Violence:    . Fear of Current or Ex-Partner:    . Emotionally Abused:    Marland Kitchen Physically Abused:    . Sexually Abused:        ALLERGIES:  No Known Allergies    MEDICATIONS:  Current Outpatient Medications   Medication Sig Dispense Refill   . amLODIPine (NORVASC) 5 mg Oral Tablet TAKE ONE TABLET BY MOUTH ONCE DAILY     . cholecalciferol, vitamin D3, (VITAMIN D3 ORAL) Take by mouth (Patient not taking: Reported on 02/03/2020)     . ergocalciferol, vitamin D2, (DRISDOL) 1,250 mcg (50,000 unit) Oral Capsule TAKE ONE CAPSULE BY MOUTH twice A WEEK (Patient not taking: Reported on 12/22/2019)     . Ibuprofen (ADVIL) 200 mg Oral Tablet Take 200 mg by mouth Four times a day as needed for Pain     . lisinopriL (PRINIVIL) 20 mg Oral Tablet 40 mg Once a day      . meloxicam (MOBIC) 7.5 mg Oral Tablet 15 mg Once a day  (Patient not taking: Reported on 02/03/2020)     . multivitamin with minerals (ONE-A-DAY 50 PLUS ORAL) Take by mouth     . venlafaxine (EFFEXOR XR) 37.5 mg Oral Capsule, Sust. Release 24 hr 75 mg Once a day        No current facility-administered medications for this visit.         REVIEW OF SYMPTOMS:  Patient denies fever, chills, chest pain or shortness of breath. All other review of systems reviewed and were negative.        PHYSICAL EXAM:    Vitals:     02/03/20 1028   Weight: (!) 200 kg (441 lb)   Height: 1.702 m (5\' 7" )   PainSc:   7    Body mass index is 69.07 kg/m.    General:            Healthy and cooperative,  no distress  Psychiatric:       Normal mood and affect, Alert, oriented to time, person, place   Lungs:               Respirations symmetric and non-labored       Skin:               No scars, lesions or rashes or erythema  Lymphatic:          No lymphadenopathy  Neurologic:       Dermatomal sensation intact    Musculoskeletal:      Left knee evaluated today.  The patient has no effusion or erythema.  She does have medial and lateral joint line tenderness.  She has full flexion and extension of the left knee but pain with range of motion testing that is severe.  Collateral ligaments intact.             IMAGING:     X-rays done on February 03, 2020 reviewed today by myself in the PACS shows severe degenerative changes to the left knee. Report was also reviewed.    ASSESSMENT:    1. Left knee pain    2. Osteoarthritis of multiple joints    3. Osteoarthritis of left knee    4. Internal derangement of knee, unspecified laterality  PLAN:      Since patient's pain has progressed and has gotten a lot worse despite conservative including injection and inflammatories.  Since she is not having weakness I am recommending an MRI/  I order this today and we will see her back afterwards.  All questions were answered to the patient's satisfaction.        This note was partially generated using MModal Fluency Direct system, and there may be some incorrect words, spellings, and punctuation that were not noted in checking the note before saving.     Lorenso Quarry, DO 02/03/2020, 12:36

## 2020-02-16 ENCOUNTER — Other Ambulatory Visit: Payer: Self-pay

## 2020-02-16 ENCOUNTER — Other Ambulatory Visit (HOSPITAL_COMMUNITY): Payer: Self-pay

## 2020-02-16 ENCOUNTER — Ambulatory Visit
Admission: RE | Admit: 2020-02-16 | Discharge: 2020-02-16 | Disposition: A | Discharge status: Home / Self Care | Payer: MEDICAID | Source: Ambulatory Visit | Attending: Sports Medicine | Admitting: Sports Medicine

## 2020-02-16 DIAGNOSIS — M159 Polyosteoarthritis, unspecified: Secondary | ICD-10-CM | POA: Insufficient documentation

## 2020-02-16 DIAGNOSIS — M25562 Pain in left knee: Secondary | ICD-10-CM | POA: Insufficient documentation

## 2020-02-16 DIAGNOSIS — M1712 Unilateral primary osteoarthritis, left knee: Secondary | ICD-10-CM | POA: Insufficient documentation

## 2020-02-16 DIAGNOSIS — M2390 Unspecified internal derangement of unspecified knee: Secondary | ICD-10-CM | POA: Insufficient documentation

## 2020-02-22 ENCOUNTER — Ambulatory Visit: Payer: MEDICAID | Attending: Sports Medicine | Admitting: Sports Medicine

## 2020-02-22 ENCOUNTER — Other Ambulatory Visit: Payer: Self-pay

## 2020-02-22 ENCOUNTER — Encounter (HOSPITAL_BASED_OUTPATIENT_CLINIC_OR_DEPARTMENT_OTHER): Payer: Self-pay | Admitting: Sports Medicine

## 2020-02-22 VITALS — Ht 67.0 in | Wt >= 6400 oz

## 2020-02-22 DIAGNOSIS — M1712 Unilateral primary osteoarthritis, left knee: Secondary | ICD-10-CM | POA: Insufficient documentation

## 2020-02-22 DIAGNOSIS — M25562 Pain in left knee: Secondary | ICD-10-CM

## 2020-02-22 NOTE — Progress Notes (Signed)
I personally offered the service to the patient, and obtained verbal consent to provide this service.    [facility], MEDICAL OFFICE BUILDING  [address]  [place] [address]  Operated by [facility]  Telephone Visit    Name:  Dawn Fernandez MRN: [id number]   Date:  02/22/2020 Age:   54 y.o.     The patient/family initiated a request for telephone service.  Verbal consent for this service was obtained from the patient/family.    Last office visit in this department: 02/03/2020      Reason for call:  Left knee pain  Call notes:  The patient does have findings of severe arthritis in her left knee especially of the patellofemoral component as well as a knee joint effusion.  I talked with patient about treatment options including trying another injection under ultrasound guidance to help with her arthritis as well as knee surgery as well as a knee brace.  At this point, the patient does not want to do surgery.  She does want to try another injection, so I will schedule her for that and give her a knee brace and I will see her back on her procedure day for another injection in her left knee under ultrasound guidance.  The patient seemed pleased and agreeable with this plan.          ICD-10-CM    1. Left knee pain  M25.562 SCHEDULE Elhadji Pecore/[facility] ULTRASOUND PROCEDURE     DME KNEE BRACE(REQUISITION/REQUEST)   2. Primary osteoarthritis of left knee  M17.12 SCHEDULE Zayleigh Stroh/[facility] ULTRASOUND PROCEDURE     DME KNEE BRACE(REQUISITION/REQUEST)       Total provider time spent with the patient on the phone: 10 minutes.    Brayton Layman, DO

## 2020-03-22 ENCOUNTER — Encounter (INDEPENDENT_AMBULATORY_CARE_PROVIDER_SITE_OTHER): Payer: Self-pay | Admitting: Rheumatology

## 2020-03-22 ENCOUNTER — Encounter (HOSPITAL_BASED_OUTPATIENT_CLINIC_OR_DEPARTMENT_OTHER): Payer: Self-pay | Admitting: Sports Medicine

## 2020-03-22 ENCOUNTER — Ambulatory Visit: Payer: MEDICAID | Attending: Sports Medicine | Admitting: Sports Medicine

## 2020-03-22 ENCOUNTER — Other Ambulatory Visit: Payer: Self-pay

## 2020-03-22 ENCOUNTER — Ambulatory Visit (HOSPITAL_BASED_OUTPATIENT_CLINIC_OR_DEPARTMENT_OTHER): Payer: MEDICAID

## 2020-03-22 DIAGNOSIS — M25562 Pain in left knee: Secondary | ICD-10-CM

## 2020-03-22 DIAGNOSIS — M1712 Unilateral primary osteoarthritis, left knee: Secondary | ICD-10-CM

## 2020-03-22 NOTE — Nursing Note (Signed)
03/22/20 1100   Medication Administration   Initials hng   Medication  Lidocaine   Medication Dose Given 8cc   Site Left Knee   NDC # 7903-8333-83   LOT # [id number]   Expiration date 01/04/20   Manufacturer hospira   Clinic Supplied Yes   Patient Supplied No

## 2020-03-22 NOTE — Nursing Note (Signed)
03/22/20 1156   Medication Administration   Initials hng   Medication  Kenalog   Medication Dose Given 1cc   Site Left Knee   NDC # 6712-4580-9   LOT # [id number]   Expiration date 07/06/21   Manufacturer amneal   Clinic Supplied Yes   Patient Supplied No

## 2020-03-22 NOTE — Progress Notes (Unsigned)
Ultrasound Guided Needle Placement Procedure    Patient: Dawn Fernandez  DOB: 1966/04/01  Encounter Date:  03/22/2020 at 11:30 AM EDT    Vitals:    03/22/20 1125   Weight: (!) 200 kg (441 lb)   Height: 1.702 m (5\' 7" )       Body mass index is 69.07 kg/m.      Procedure: left knee intra-articular joint injection.    Clinical Indication:  1. Left knee pain    2. Primary osteoarthritis of left knee        Indication for usage of ultrasound guidance: Ultrasound guidance is medically necessary to ensure accurate needle placement to effectively and safely treat patient's condition and to avoid neurovascular structures.    Technique:  After written consent was obtained and time out was performed. The injection site located and marked. Then under sterile conditions, patient injected with 6 cc of 1% lidocaine for anesthesia. A GE LOGIQe Ultrasound unit with a variable frequency (4 - 12 MHz) linear transducer was used to localize the placement of a 22 gauge 1.5 inch needle into the left knee intra-articular joint . Patient was then injected with a mixture of 2 cc of 1% lidocaine with 1 cc kenalog. Patient tolerated the procedure well; no complications.     Finding: Successful needle placement for left knee intra-articular joint  injection.    , DO 03/22/2020, 11:50

## 2020-03-25 ENCOUNTER — Encounter (INDEPENDENT_AMBULATORY_CARE_PROVIDER_SITE_OTHER): Payer: Self-pay | Admitting: Rheumatology

## 2022-07-06 ENCOUNTER — Other Ambulatory Visit (HOSPITAL_COMMUNITY): Payer: Self-pay | Admitting: PHYSICIAN ASSISTANT

## 2022-08-13 ENCOUNTER — Ambulatory Visit (HOSPITAL_BASED_OUTPATIENT_CLINIC_OR_DEPARTMENT_OTHER): Payer: Self-pay

## 2022-08-16 ENCOUNTER — Ambulatory Visit (HOSPITAL_BASED_OUTPATIENT_CLINIC_OR_DEPARTMENT_OTHER): Payer: Self-pay

## 2022-08-27 ENCOUNTER — Other Ambulatory Visit: Payer: Self-pay

## 2022-08-27 ENCOUNTER — Ambulatory Visit (HOSPITAL_BASED_OUTPATIENT_CLINIC_OR_DEPARTMENT_OTHER): Payer: MEDICAID

## 2022-08-27 DIAGNOSIS — M79604 Pain in right leg: Secondary | ICD-10-CM | POA: Insufficient documentation

## 2022-08-27 DIAGNOSIS — I089 Rheumatic multiple valve disease, unspecified: Secondary | ICD-10-CM | POA: Insufficient documentation

## 2022-08-27 DIAGNOSIS — M79605 Pain in left leg: Secondary | ICD-10-CM | POA: Insufficient documentation

## 2022-08-27 DIAGNOSIS — I890 Lymphedema, not elsewhere classified: Secondary | ICD-10-CM

## 2022-08-27 NOTE — Progress Notes (Signed)
Subjective Evaluation    History of Present Illness  Date of onset: 08/05/2022  Mechanism of injury: Patient is a 56y/o female who is referred for bil LE secondary stage II lymphedema . She has recent onset over the past 3 months of changes in the tissue, with weeping and bubbles of the skin. NO noted CHF, CKD and or blood clots. She denies infection but was put on Kflex in the middle of summer . She has not tried compression and elevation does not help . She lives by herself and uses a Corporate investment banker and has a caregiver, Dawn Fernandez who helps 5 hours 4 days a week. She presently has no present compression and lives in apartments denies any falls. She is concerned as the skin has changed and was weeping but now just has small bumps which have formed on them .     Quality of life: good    Pain  Current pain rating: 2 (heaviness reported)  At best pain rating: 0  At worst pain rating: 2  Quality: pressure and discomfort  Relieving factors: rest  Progression: worsening    Social Support  Lives in: apartment  Lives with: alone    Patient Goals  Patient goals for therapy: decreased edema and decreased pain    Precautions: None that Apply       Objective     Static Posture     Comments  RLE LLE   26 cm 26 cm   39 cm 39 cm   62 cm 60 cm   68 cm 65cm   72 cm 69 cm                     Assessment & Plan     Assessment  Assessment details: Patient is a 56 y/o female who is referred this day for 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 Patient is referred by her PCP with recommendations for compression management. Patient has had chronic STAGE II lymphedema which is not controlled despite using compression garments and trying elevation. Patient has significant, swelling, discomfort/ pain 2/10 with sensitivity in the tissues. She is noted to have changes in skin integrity with papilloma formed where there is observed  to be an  risk for infection, ulcers and/or  Weeping.  Patient has not yet tried  any kind of application and or  use of compression garments . She is still having difficulty managing this chronic condition and elevation does not help . Patient will benefit from lymphedema treatment with focus on manual treatment, compression , skin care education, lymphedema precautions, t and exploring options for long term compression management of this condition     Prognosis: good  Prognosis details: Good for present goals    Goals   Independent with self-care management of STAGE II secondary lymphedema by discharge  Independent with wearing compression to assist in controlling this condition by discharge  Patient to have decreased pain to 1/10 by discharge  Patient ot be independent with skin care education by discharge  Patient to be Indepedent with Lymphedema precautions by discharge  Patient to be independent with lymphedema HEP by discharge      Plan  Frequency: 1x week  Duration in weeks: 8  Treatment plan discussed with: patient and caregiver  Plan details: Patient will be seen for lymphedema conservative treatment to address long term needs for self care management including, skin care, lymphedema precautions and daily needs for compression management .  [id number] - PT EVAL - LOW  CODE [id number], [id number] - PT MANUAL THERAPY (15)  CODE [id number], [id number] - PT ADL SELF CARE (15)  CODE [id number], and [id number] - PT VASOPNEUMATIC COMPRESSION  CODE [id number]    VISIT 1     Patient is seen this day for initial evaluation to address sensitivity 2/10  and changes in skin integrity where patient will benefit from lymphedema treatment with focus on reducing swelling,  address skin integrity though skin care education, compression, lymphedema precautions, and fitting for  custom made gradient  compression garments  Patient engaged in multilayer compression bandaging from MTP to 2 inches below the popliteal angle with focus on wearing these up to 24 hour then transition to tg grip II E which is therapeutic compression until  she can tolete medical grade compression and /or ready wraps.  Will modify and update treatment as needed pending patients level of swelling and tolerance for compression.

## 2022-09-04 ENCOUNTER — Other Ambulatory Visit: Payer: Self-pay

## 2022-09-04 ENCOUNTER — Ambulatory Visit (HOSPITAL_BASED_OUTPATIENT_CLINIC_OR_DEPARTMENT_OTHER): Payer: MEDICAID

## 2022-09-04 DIAGNOSIS — I890 Lymphedema, not elsewhere classified: Secondary | ICD-10-CM

## 2022-09-04 NOTE — Progress Notes (Signed)
VISIT 2     Patient returns this day where she was compliant with all compression bandaging and did have reduction of 2 cm . Tissues are softer and reduced in fibrosis is +1. She will continue to wear this compression as instructed and them be fitted for  custom made gradient  compression garments  Patient engaged in multilayer compression bandaging from MTP to 2 inches below the popliteal angle with focus on wearing these up to 24 hour then transition to tg grip II E which is therapeutic compression until she can tolete medical grade compression and /or ready wraps.  Will modify and update treatment as needed pending patients level of swelling and tolerance for compression.  Patient denies pain 0/10     RLE LLE   26 cm 26 cm   39 cm 39 cm   60 cm58 cm   67 cm 63cm   68 cm67cm

## 2022-09-11 ENCOUNTER — Other Ambulatory Visit: Payer: Self-pay

## 2022-09-11 ENCOUNTER — Ambulatory Visit (HOSPITAL_BASED_OUTPATIENT_CLINIC_OR_DEPARTMENT_OTHER): Payer: MEDICAID

## 2022-09-11 DIAGNOSIS — I890 Lymphedema, not elsewhere classified: Secondary | ICD-10-CM

## 2022-09-11 NOTE — Progress Notes (Signed)
VISIT 3     Patient returns this day where she was compliant with all compression bandaging and blister areas are now flat and not weeping did have reduction of 2 cm . Tissues are softer and reduced in fibrosis is +1. She does feel the compression is helping Fitted for  custom made gradient  compression garments  Patient engaged in multilayer compression bandaging from MTP to 2 inches below the popliteal angle with focus on wearing these up to 24 hour then transition to tg grip II E which is therapeutic compression until she can tolete medical grade compression and /or ready wraps.  Will modify and update treatment as needed pending patients level of swelling and tolerance for compression.  Patient denies pain 0/10     RLE LLE   26 cm 26 cm   39 cm 39 cm   60 cm58 cm   67 cm 63cm   68 cm67cm

## 2022-09-19 ENCOUNTER — Ambulatory Visit: Payer: MEDICAID

## 2022-09-19 ENCOUNTER — Other Ambulatory Visit: Payer: Self-pay

## 2022-09-19 DIAGNOSIS — I890 Lymphedema, not elsewhere classified: Secondary | ICD-10-CM

## 2022-09-19 NOTE — Progress Notes (Signed)
VISIT 4      Patient presents for measuring for new gradient custom made knee high compression garments 20-60mmHg She has been compliant wearing  compression bandaging. Soft tissue integrity is soft , pliable with fibrosis +1  and blister areas are now flat and not weeping did have reduction of 2 cm . Tissues are softer and reduced in fibrosis is +1. She does feel the compression is helping measurement for new gradient custom made gradient  compression garments  Patient engaged in multilayer compression bandaging from MTP to 2 inches below the popliteal angle with focus on wearing these up to 24 hour then transition to tg grip II E which is therapeutic compression  Will modify and update treatment as needed pending patients level of swelling and tolerance for compression.  Patient denies pain 0/10     RLE LLE   26 cm 26 cm   39 cm 39 cm   60 cm58 cm   67 cm 63cm   68 cm67cm

## 2022-10-09 ENCOUNTER — Other Ambulatory Visit: Payer: Self-pay

## 2022-10-09 ENCOUNTER — Ambulatory Visit: Payer: MEDICAID | Attending: PHYSICIAN ASSISTANT

## 2022-10-09 DIAGNOSIS — M79604 Pain in right leg: Secondary | ICD-10-CM | POA: Insufficient documentation

## 2022-10-09 DIAGNOSIS — I890 Lymphedema, not elsewhere classified: Secondary | ICD-10-CM

## 2022-10-09 DIAGNOSIS — M79605 Pain in left leg: Secondary | ICD-10-CM | POA: Insufficient documentation

## 2022-10-09 DIAGNOSIS — I089 Rheumatic multiple valve disease, unspecified: Secondary | ICD-10-CM | POA: Insufficient documentation

## 2022-10-09 NOTE — Progress Notes (Signed)
VISIT 5      Patient returns for self care management new custom made compression knee high garments . She has been compliant wearing  compression bandaging and tissues are now less fibrotic , with no noted blister areas while the paplomas are still present. REview of daily needs for compression, elevation and skin care education. She will benefit from new garments every 6 months  Patient denies pain 0/10  And will have assist from caregiver to carryout this out daily.    RLE LLE   26 cm 26 cm   39 cm 39 cm   60 cm58 cm   67 cm 63cm   68 cm67cm

## 2023-04-18 ENCOUNTER — Other Ambulatory Visit (HOSPITAL_COMMUNITY): Payer: Self-pay | Admitting: PHYSICIAN ASSISTANT

## 2023-04-18 DIAGNOSIS — I890 Lymphedema, not elsewhere classified: Secondary | ICD-10-CM

## 2023-06-13 ENCOUNTER — Other Ambulatory Visit: Payer: Self-pay

## 2023-06-13 ENCOUNTER — Ambulatory Visit: Payer: MEDICAID | Attending: PHYSICIAN ASSISTANT

## 2023-06-13 DIAGNOSIS — I890 Lymphedema, not elsewhere classified: Secondary | ICD-10-CM | POA: Insufficient documentation

## 2023-06-13 NOTE — Progress Notes (Addendum)
Occupational Therapy Evaluation         [facility]         [facility]       LYMPHEDEMA EVALUATION            Subjective Evaluation    History of Present Illness  Date of onset: 04/18/2023  Mechanism of injury: Patient is a 56y/o female who returns for  secondary stage II bil LE  lymphedema . She was seen last year and had been managing this condition with gradient compression garments, elevation and skin care. PMH No DVT,  CKD and or CHF .  She  lives by herself and uses caregiver, Dorann Lodge who helps 5 hours 4 days a week. She presently has her old compression and lives in apartments denies any falls. She is concerned as the skin has changed and the papillomas are presents but not weeping  She will be fitted for new compression and explore options for compression management     Quality of life: good    Pain  Current pain rating: 0 (heaviness reported)  At best pain rating: 0  At worst pain rating: 0  Quality: pressure and discomfort  Relieving factors: rest  Progression: worsening    Social Support  Lives in: apartment  Lives with: alone    Patient Goals  Patient goals for therapy: decreased edema and decreased pain    Precautions: None that Apply       Objective     Static Posture     Comments  RLE LLE   26 cm 26 cm   39 cm 39 cm   62 cm 60 cm   68 cm 65cm   72 cm 69 cm                                    X  Lymphedema, not elsewhere classified [I89.0]    Secondary to:     Tarda    Other:                           LOCATION OF LYMPHEDEMA                    Right Leg  > Left Leg      SEVERITY SYMPTOMS AND OBSERVATIONS FROM PHYSICAL EXAM   Stage of Lymphedema:  Stage II       X Hyperkeratosis     X Hyperpigmentation     X Lymphorrhea (weeping)     X Papillomatosis (warts, nodules, papules)            X Fibrosis     X Pitting edema     X Recurrent episode(s) of infection/cellulitis     X Pain     X Venous leg ulcers and/or wounds    Functional impairments to include limited range of motion or impaired  mobility.     Other, explain:       CONSERVATIVE TREATMENT (CT) INITATION   Start date (if different from this visit date):      Conservative treatment plan includes the following activities (check all that apply):     X Regular exercise    X  Elevation of the limb(s)     X Compression garment(s) or compression bandage system     X Compression garments with a minimum of 20-30    mmHg  distally     X Compression bandage system    X Type:  Multi-layer short stretch  Velcro compression wrap(s)      X  One-time use pre-packaged bandaging (i.e., Profore, Unna boot, Coban)     Other:      X Complete decongestive therapy (CDT) or lymphedema therapy       X  Minimum of 30 minutes daily MLD or self-MLD      Has the patient tried an W1191 basic pneumatic compression device?  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 Patient is referred by her PCP with recommendations for compression management. Patient has had chronic STAGE II lymphedema which is not controlled despite using compression garments and trying elevation. Patient has significant, swelling with greater swelign of the RLE , discomfort/ pain0/10 with sensitivity in the tissues and reports of heavniess. . She is noted to have changes in skin integrity with papilloma formed where there is observed  to be an  risk for infection, ulcers and/or  Weeping.  Patient has been in gradient compression garments and has completed conservative lymphedema treatment last year. . She is still having difficulty managing this chronic condition and elevation does not help . Patient will benefit from lymphedema treatment with focus on manual treatment, compression , skin care education, lymphedema precautions,  and exploring options for long term compression management of this condition     Prognosis: good  Prognosis  details: Good for present goals    Goals   Independent with self-care management of STAGE II secondary lymphedema by discharge  Independent with wearing compression to assist in controlling this condition by discharge  Patient to have decreased pain to 1/10 by discharge  Patient ot be independent with skin care education by discharge  Patient to be Indepedent with Lymphedema precautions by discharge      Plan  Planned therapy interventions: compression  Frequency: 1x month  Duration in weeks: 12  Treatment plan discussed with: patient and caregiver  Plan details: Patient will be seen for lymphedema conservative treatment to address long term needs for self care management including, skin care, lymphedema precautions and daily needs for compression management including measurement and fitting for compression garments .       [id number] - PT EVAL - LOW  CODE [id number], [id number] - PT MANUAL THERAPY (15)  CODE [id number], [id number] - PT ADL SELF CARE (15)  CODE [id number], and [id number] - PT VASOPNEUMATIC COMPRESSION  CODE [id number]    VISIT 1    Total time36 minutes ( 21 min evaluation, self care 15 min   Patient presents this day for lymphedema evaluation and  treatment with concerns of  self-care management due to chronic swelling , discomfort with noted heaviness and STAGE II lymphedema. Will focus on exploring options new compression including custom made gradient 20-46mmhg  garments and /or  a ready wrap .  Review of lymphedema precautions, skin care education and ongoing needs for compression daily. She will return for fitting custom made compression gradient garments as she will trial therapeutic compression tg grip II F for now then transition as this will best benefit for this chronic condition.  Will modify and update treatment as needed pending patients level of swelling and tolerance for compression.

## 2023-06-19 ENCOUNTER — Ambulatory Visit (INDEPENDENT_AMBULATORY_CARE_PROVIDER_SITE_OTHER): Payer: MEDICAID

## 2023-07-19 ENCOUNTER — Other Ambulatory Visit (HOSPITAL_COMMUNITY): Payer: Self-pay | Admitting: PHYSICIAN ASSISTANT

## 2023-07-19 DIAGNOSIS — B351 Tinea unguium: Secondary | ICD-10-CM

## 2023-07-22 ENCOUNTER — Ambulatory Visit (HOSPITAL_BASED_OUTPATIENT_CLINIC_OR_DEPARTMENT_OTHER): Payer: Self-pay

## 2023-08-05 ENCOUNTER — Ambulatory Visit: Payer: MEDICAID | Attending: PHYSICIAN ASSISTANT

## 2023-08-05 ENCOUNTER — Other Ambulatory Visit: Payer: Self-pay

## 2023-08-05 DIAGNOSIS — I890 Lymphedema, not elsewhere classified: Secondary | ICD-10-CM | POA: Insufficient documentation

## 2023-08-05 NOTE — Progress Notes (Signed)
Occupational Therapy Visit     Visit 2     Patient returns this day where he was fitted for new custom made gradient 20-30 mmHg  compression garments. Patient was instructed on donning/doffing , laundering and wearing regimen. He is to begin wearing these up to 8 hours daily and then take off wash and leave flat to drive. Emphasized importance of wearing gradient compression daily for long term self care management along with elevation and staying active / carrying out therapeutic  exercises. Patient also educated on the need for new compression garments every 6 months to best address this chronic condition of lymphedema . Patient will begin to carry this out daily and return in 1 month for any further lymphedema needs.

## 2023-08-15 ENCOUNTER — Other Ambulatory Visit (HOSPITAL_COMMUNITY): Payer: Self-pay | Admitting: PHYSICIAN ASSISTANT

## 2023-08-15 DIAGNOSIS — B351 Tinea unguium: Secondary | ICD-10-CM

## 2023-10-14 ENCOUNTER — Other Ambulatory Visit: Payer: Self-pay

## 2023-10-14 ENCOUNTER — Encounter (HOSPITAL_BASED_OUTPATIENT_CLINIC_OR_DEPARTMENT_OTHER): Payer: Self-pay | Admitting: Podiatrist

## 2023-10-14 ENCOUNTER — Ambulatory Visit: Payer: MEDICAID | Attending: Podiatrist | Admitting: Podiatrist

## 2023-10-14 DIAGNOSIS — M79674 Pain in right toe(s): Secondary | ICD-10-CM | POA: Insufficient documentation

## 2023-10-14 DIAGNOSIS — B351 Tinea unguium: Secondary | ICD-10-CM | POA: Insufficient documentation

## 2023-10-14 DIAGNOSIS — M79675 Pain in left toe(s): Secondary | ICD-10-CM | POA: Insufficient documentation

## 2023-10-14 NOTE — Progress Notes (Signed)
[facility]  [facility]tho and [facility]    Dawn Fernandez   [id number]   10/14/2023     HPI:  Patient is seen today complaining of pain in his feet.  Pain is located in the toes.  She states that her nails are thickened long given lot of discomfort.  Hurts with pressure from shoe gear.  Onset was insidious.  She denies any trauma to the area.  She states this is progressively getting worse.    History: The patients past medical history, medications, allergies, surgical history, family history, social history were reviewed and updated in the patients chart    Review of Systems: Negative findings for all systems identified including constitutional, eyes, respiratory, hematologic/lymphatic, neurologic, ears nose throat mouth, urogenital, skin , endocrine , gastrointestinal, cardiovascular, physiological, neuromuscular    Physical exam: Patient is alert and oriented to person, place, and time. she is well nourished and well dressed.  There were no vitals filed for this visit.         Respiratory:  No signs of respiratory distress.  No use of accessory muscles or intercostal retractions.    Vascular examination: DP+1/4 bilaterally. PT +1/4 bilaterally.  Capillary fill time less than 3 seconds one through 5 bilaterally. Skin temperature is warm to cool from the tibia to the toes.  Hair growth is absent.  Patient has significant lymphedema bilateral.  Skin shows decreased elasticity and turgor.     Lymphatic Examination: No lymphadenopathy noted b/l    Neurological examination: Coordination within normal limits in the right and left lower extremity. Light touch is intact bilaterally, protective sensation is intact bilaterally, proprioception is intact bilaterally. Deep tendon reflexes at the ankle are +2/4 bilaterally.     Dermatological examination: Nails x10 are present.  Nails are thickened, dystrophic, with subungual debris, positive fungal odor.  Pain with palpation.  Nails are  proximally 1 cm thick.  There are no ulcers rashes or hyperkeratotic lesions noted.    Musculoskeletal examination:   Patient has full range of motion of all joints lower extremity without pain or crepitus.  Muscle strength is +5/5 in all fields.  No structural deformities are noted.    Imaging:    Assessment:  Onychomycosis  Toe pain bilateral    Plan: Debrided nails times 10 in length and thickness.  We did discuss removal of nails.

## 2024-01-31 ENCOUNTER — Other Ambulatory Visit (HOSPITAL_COMMUNITY): Payer: Self-pay

## 2024-01-31 DIAGNOSIS — R3981 Functional urinary incontinence: Secondary | ICD-10-CM

## 2024-04-14 ENCOUNTER — Ambulatory Visit (HOSPITAL_BASED_OUTPATIENT_CLINIC_OR_DEPARTMENT_OTHER): Payer: Self-pay | Admitting: PHYSICIAN ASSISTANT
